# Patient Record
Sex: Female | Born: 1940 | Race: White | Hispanic: No | Marital: Married | Smoking: Former smoker
Health system: Southern US, Community
[De-identification: ages and names within clinical notes are randomized; demographics above are authoritative.]

## PROBLEM LIST (undated history)

## (undated) DIAGNOSIS — D649 Anemia, unspecified: Secondary | ICD-10-CM

## (undated) DIAGNOSIS — E049 Nontoxic goiter, unspecified: Secondary | ICD-10-CM

## (undated) DIAGNOSIS — K219 Gastro-esophageal reflux disease without esophagitis: Secondary | ICD-10-CM

## (undated) DIAGNOSIS — E785 Hyperlipidemia, unspecified: Secondary | ICD-10-CM

## (undated) DIAGNOSIS — M8580 Other specified disorders of bone density and structure, unspecified site: Secondary | ICD-10-CM

## (undated) DIAGNOSIS — C801 Malignant (primary) neoplasm, unspecified: Secondary | ICD-10-CM

## (undated) DIAGNOSIS — Z9889 Other specified postprocedural states: Secondary | ICD-10-CM

## (undated) DIAGNOSIS — R112 Nausea with vomiting, unspecified: Secondary | ICD-10-CM

## (undated) DIAGNOSIS — D469 Myelodysplastic syndrome, unspecified: Secondary | ICD-10-CM

## (undated) DIAGNOSIS — M1990 Unspecified osteoarthritis, unspecified site: Secondary | ICD-10-CM

## (undated) HISTORY — DX: Gastro-esophageal reflux disease without esophagitis: K21.9

## (undated) HISTORY — DX: Hyperlipidemia, unspecified: E78.5

## (undated) HISTORY — PX: TUBAL LIGATION: SHX77

## (undated) HISTORY — DX: Other specified disorders of bone density and structure, unspecified site: M85.80

## (undated) HISTORY — DX: Nontoxic goiter, unspecified: E04.9

---

## 1966-08-04 HISTORY — PX: LAPAROSCOPIC SALPINGOOPHERECTOMY: SUR795

## 1966-08-04 HISTORY — PX: ABDOMINAL SURGERY: SHX537

## 1997-08-04 HISTORY — PX: COLONOSCOPY: SHX174

## 2004-08-16 ENCOUNTER — Ambulatory Visit: Payer: Self-pay | Admitting: Otolaryngology

## 2004-11-19 ENCOUNTER — Ambulatory Visit: Payer: Self-pay | Admitting: Otolaryngology

## 2005-03-21 ENCOUNTER — Ambulatory Visit: Payer: Self-pay | Admitting: Otolaryngology

## 2005-04-23 ENCOUNTER — Ambulatory Visit: Payer: Self-pay | Admitting: Internal Medicine

## 2005-08-04 HISTORY — PX: FUNCTIONAL ENDOSCOPIC SINUS SURGERY: SUR616

## 2006-02-17 ENCOUNTER — Ambulatory Visit: Payer: Self-pay | Admitting: Gastroenterology

## 2006-04-28 ENCOUNTER — Ambulatory Visit: Payer: Self-pay | Admitting: Internal Medicine

## 2007-05-03 ENCOUNTER — Ambulatory Visit: Payer: Self-pay | Admitting: Internal Medicine

## 2007-12-28 ENCOUNTER — Ambulatory Visit: Payer: Self-pay | Admitting: Internal Medicine

## 2008-01-07 ENCOUNTER — Ambulatory Visit: Payer: Self-pay | Admitting: Internal Medicine

## 2008-05-03 ENCOUNTER — Ambulatory Visit: Payer: Self-pay | Admitting: Internal Medicine

## 2009-05-04 ENCOUNTER — Ambulatory Visit: Payer: Self-pay | Admitting: Internal Medicine

## 2009-06-16 ENCOUNTER — Ambulatory Visit: Payer: Self-pay | Admitting: Family Medicine

## 2010-05-06 ENCOUNTER — Ambulatory Visit: Payer: Self-pay | Admitting: Internal Medicine

## 2011-05-08 ENCOUNTER — Ambulatory Visit: Payer: Self-pay | Admitting: Internal Medicine

## 2011-05-28 ENCOUNTER — Ambulatory Visit: Payer: Self-pay | Admitting: Gastroenterology

## 2012-03-04 ENCOUNTER — Observation Stay: Payer: Self-pay | Admitting: Internal Medicine

## 2012-03-04 LAB — CBC
HGB: 14.2 g/dL (ref 12.0–16.0)
MCHC: 34.6 g/dL (ref 32.0–36.0)
RBC: 4.63 10*6/uL (ref 3.80–5.20)

## 2012-03-04 LAB — CK TOTAL AND CKMB (NOT AT ARMC)
CK, Total: 54 U/L (ref 21–215)
CK-MB: 0.7 ng/mL (ref 0.5–3.6)
CK-MB: 0.6 ng/mL (ref 0.5–3.6)

## 2012-03-04 LAB — PROTIME-INR: Prothrombin Time: 13 secs (ref 11.5–14.7)

## 2012-03-04 LAB — COMPREHENSIVE METABOLIC PANEL
Albumin: 3.7 g/dL (ref 3.4–5.0)
Alkaline Phosphatase: 99 U/L (ref 50–136)
Anion Gap: 9 (ref 7–16)
BUN: 18 mg/dL (ref 7–18)
Bilirubin,Total: 0.3 mg/dL (ref 0.2–1.0)
Calcium, Total: 8.8 mg/dL (ref 8.5–10.1)
Co2: 24 mmol/L (ref 21–32)
Creatinine: 0.82 mg/dL (ref 0.60–1.30)
EGFR (Non-African Amer.): >60
Osmolality: 279 (ref 275–301)
Potassium: 3.9 mmol/L (ref 3.5–5.1)
Sodium: 139 mmol/L (ref 136–145)
Total Protein: 7.3 g/dL (ref 6.4–8.2)

## 2012-03-04 LAB — APTT: Activated PTT: 37.6 secs — ABNORMAL HIGH (ref 23.6–35.9)

## 2012-03-05 LAB — TROPONIN I: Troponin-I: <0.02 ng/mL

## 2012-03-05 LAB — TSH: Thyroid Stimulating Horm: 0.741 u[IU]/mL

## 2012-03-05 LAB — LIPID PANEL
Cholesterol: 242 mg/dL — ABNORMAL HIGH (ref 0–200)
HDL Cholesterol: 46 mg/dL (ref 40–60)
Ldl Cholesterol, Calc: 162 mg/dL — ABNORMAL HIGH (ref 0–100)
VLDL Cholesterol, Calc: 34 mg/dL (ref 5–40)

## 2012-03-05 LAB — CK TOTAL AND CKMB (NOT AT ARMC): CK, Total: 49 U/L (ref 21–215)

## 2012-04-26 ENCOUNTER — Ambulatory Visit: Payer: Self-pay | Admitting: Gastroenterology

## 2012-04-28 LAB — PATHOLOGY REPORT

## 2012-06-01 ENCOUNTER — Ambulatory Visit: Payer: Self-pay | Admitting: Internal Medicine

## 2013-06-02 ENCOUNTER — Ambulatory Visit: Payer: Self-pay | Admitting: Internal Medicine

## 2014-06-11 DIAGNOSIS — E049 Nontoxic goiter, unspecified: Secondary | ICD-10-CM

## 2014-06-11 DIAGNOSIS — E040 Nontoxic diffuse goiter: Secondary | ICD-10-CM

## 2014-06-11 HISTORY — DX: Nontoxic diffuse goiter: E04.0

## 2014-06-11 HISTORY — DX: Nontoxic goiter, unspecified: E04.9

## 2014-06-21 ENCOUNTER — Ambulatory Visit: Payer: Self-pay | Admitting: Internal Medicine

## 2014-11-21 NOTE — Discharge Summary (Signed)
PATIENT NAME:  Kathryn Boyd, BACH MR#:  [id number] DATE OF BIRTH:  May 11, 1941  DATE OF ADMISSION:  03/04/2012 DATE OF DISCHARGE:  03/05/2012  ADMITTING DIAGNOSIS: Chest pain.   DISCHARGE DIAGNOSES:  1. Chest pain of unclear etiology, substernal, resolved.  2. Painful respirations. Negative cardiac enzymes for injury. Negative D-dimer. 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 Also questionable apical defect. However Dr. Clayborn Bigness reports poor test quality and patient needs outpatient cariology followup.  3. History of gastroesophageal reflux disease. 4. Hyperlipidemia with LDL 162.  5. Allergies.   DISCHARGE CONDITION: Stable.   DISCHARGE MEDICATIONS: The patient is to resume her outpatient medications which are:  1. Zyrtec 10 mg p.o. daily.  2. Vitamin B 12, unknown dose once daily.  3. Multivitamins once daily. 4. Vitamin E, unknown dose daily. 5. Vitamin C, unknown dose daily.  6. Flaxseed oil oral capsule once daily.  7. Caltrate 600 mg p.o. twice daily.  8. Fish oil 1 capsule twice daily.  9. Aspirin 81 mg p.o. daily.  10. Docusate 300 mg p.o. daily.  11. Flonase two sprays once daily in the evening.  12. Omeprazole 10 mg p.o. daily.   ADDITIONAL MEDICATIONS:  Lisinopril 2.5 mg p.o. daily.   DIET: 2 grams salt, low fat, low cholesterol.   PHYSICAL ACTIVITY LIMITATIONS: As tolerated.  FOLLOWUP: Follow-up appointment with Dr. Arline Asp two days after discharge as well as Dr. Saralyn Pilar two days after discharge.   CONSULTS: None.  RADIOLOGIC STUDIES:  1. Chest x-ray, portable single view 03/04/2012 showed no acute abnormality.  2. Myoview stress test result is still pending.   HISTORY OF PRESENT ILLNESS: The patient is a 74 year old Caucasian female with past medical history significant for history of gastroesophageal reflux disease, history of hyperlipidemia intolerant of statins, who presented to the hospital with complaints  of chest pain. Please refer to Dr. Kelton Pillar admission note on 03/04/2012. On arrival to the hospital, the patient's temperature was 96, pulse was 88, respiration rate 20, blood pressure 136/70, saturation was 98% on room air.  Physical exam was unremarkable.  LABORATORY DATA 03/04/2012 showed normal BMP, normal liver enzymes. Normal  cardiac enzymes, first set as well as subsequent two more sets. TSH was normal at 0.741. CBC was within normal limits. Coagulation panel was unremarkable except activated PTT was slightly up to 37.6. However, D-dimer was normal at 0.22. EKG showed normal sinus rhythm with possible left atrial enlargement, borderline EKG with no significant change since January 2004. Chest x-ray was also normal.   The patient was admitted to the hospital. Her cardiac enzymes were cycled. She was started on metoprolol as well as nitroglycerin and ACE inhibitor lisinopril. In regard to chest pains, as mentioned above the patient's cardiac enzymes were cycled and the patient underwent Myoview stress test. Myoview stress test was performed on 03/05/2012 and revealed suboptimal study with no clear evidence suggesting stress-induced myocardial ischemia. Depressed left ventricular function at 32% with global hypokinesis noted. This may represent suboptimal volume flow curve. Would treat the patient medically for now. According to the cardiologist, would have the patient follow up with cardiology shortly unless symptoms persist or worsen and would then consider cardiac catheterization if they do, but the patient should be safe to be discharged home with close followup according to Dr. Clayborn Bigness. The patient was recommended to continue aspirin therapy. She was added lisinopril for afterload reduction and she is being discharged home in stable condition. We were not able to add  metoprolol because the patient's blood pressure intermittently was very low.   On the day of discharge, the patient's vital signs:  Temperature 97.4, pulse 81, respiration rate was 17, blood pressure 94 to 449 systolic and 67R to 91M to diastolic, oxygen saturation 93% to 98% on room air at rest. It is recommended to advance the patient's lisinopril dose if she in fact, shows signs on echocardiogram consistent with cardiomyopathy. She was scheduled to see Dr. Clayborn Bigness. However, she refused to be seen by Dr. Clayborn Bigness and decided to see Dr. Saralyn Pilar instead. We will be making an appointment with Dr. Saralyn Pilar. She is also to follow up with Dr. Arline Asp as outpatient.   The patient had some painful respirations. However, her D-dimer was unremarkable and her oxygenation remained stable. She will be ambulated here around the hospital nursing station and if she does not have recurrent symptoms with ambulation she will be discharged home.   In regard to gastroesophageal reflux disease the patient is to continue her PPI.   For hyperlipidemia the patient's lipid panel was performed and the patient's LDL was found to be markedly elevated at 162. The patient's total cholesterol was 242. Triglycerides were 171 and HDL was 46. The patient was advised to continue just a low fat, low cholesterol diet and she was agreeable to follow up with that. It is recommended to follow her hyperlipidemia and make decisions about and initiation of any medications as needed. The patient, however, intolerant of statins.  For allergies the patient is to continue Zithromax and Flonase. No concerns were made here.   The patient is being discharged in stable condition with the above-mentioned medications and followup.   TIME SPENT: 40 minutes.  ____________________________ Theodoro Grist, MD rv:bjt D: 03/05/2012 16:53:47 ET T: 03/06/2012 14:19:11 ET JOB#: [id number]  cc: Theodoro Grist, MD, <Dictator> Vianne Bulls. Arline Asp, MD Isaias Cowman, MD [place] MD ELECTRONICALLY SIGNED 03/08/2012 1:15

## 2014-11-21 NOTE — H&P (Signed)
PATIENT NAME:  Kathryn Boyd, Kathryn Boyd MR#:  [id number] DATE OF BIRTH:  1940-09-17  DATE OF ADMISSION:  03/04/2012  REFERRING PHYSICIAN: ER physician, Dr. Renard Hamper    PRIMARY CARE PHYSICIAN: Apolonio Schneiders, MD   CHIEF COMPLAINT: Chest pain.   HISTORY OF PRESENT ILLNESS: The patient is a 74 year old female with past medical history of gastroesophageal reflux disease, chronic sinus problems, and hyperlipidemia not on any statin medications due to myalgia, who was in her usual state of health until today while shopping for school supplies at [place] she developed sudden onset of retrosternal chest pain described as sharp, possible indigestion, radiated to the back and jaw. The patient did not get any relief from her chest pain until she went to her car and sat down to rest. The patient reports she has had a stress test but it has been a very long time ago. She normally does not eat any fried food but did eat some fried okra last night but feels that that did not cause her GERD symptoms to flare up. About two years ago the patient was admitted to [facility] overnight for high heart rate. She had extensive work-up there which was all found to be negative. She followed up with cardiologist at [facility] after that admission and had tachycardia during the office visit and EKG at that time was normal. Subsequently she has had no problems with her heart. Denies any history of coronary artery disease. Denies any family history of coronary artery disease or premature coronary artery disease.   ALLERGIES: Codeine.   PAST MEDICAL HISTORY:  1. Internal hemorrhoids. 2. Gastroesophageal reflux disease. 3. Hyperlipidemia, off statins for the past one year due to myalgia.   PAST SURGICAL HISTORY:  1. Sinus surgery. 2. Endoscopy and colonoscopy. The colonoscopy showed internal hemorrhoids. Endoscopy was normal.  3. Tubal pregnancy surgery.   HOME MEDICATIONS: 1. Aspirin 81 mg daily.  2. Calcium 600 b.i.d.  3. Colace 100 mg 3 capsules  once a day. 4. Fish Oil 1 capsule b.i.d.  5. Flax Seed Oil 1 capsule once a day.  6. Flonase two sprays each nostril daily.  7. Multivitamin 1 tablet daily.  8. Omeprazole 20 mg daily.  9. Vitamin B12 daily.  10. Vitamin C daily.  11. Vitamin E daily.  12. Zyrtec 10 mg daily.   SOCIAL HISTORY: Denies any history of smoking, alcohol, or drug abuse. Married. Lives with her husband.   FAMILY HISTORY: Mother died of cancer. Father died of lung cancer. Mother and sister had mitral valve prolapse. Grandmother had cerebral hemorrhage.  REVIEW OF SYSTEMS: CONSTITUTIONAL: Denies any fever, fatigue, weakness. EYES: Denies any blurred or double vision. ENT: Denies any tinnitus or ear pain. RESPIRATORY: Denies any cough or wheezing. CARDIOVASCULAR: Reports chest pain. History of palpitations. GI: Denies any nausea, vomiting, diarrhea, abdominal pain. GU: Denies any dysuria or hematuria. ENDOCRINE: Denies any polyuria or nocturia. HEME/LYMPH: Denies any anemia or easy bruisability. INTEGUMENTARY: Denies any acne or rash. MUSCULOSKELETAL: Denies any swelling or gout. NEUROLOGICAL: Denies any numbness or weakness. PSYCH: Denies any anxiety or depression.   PHYSICAL EXAMINATION:   VITAL SIGNS: Temperature 96, heart rate 88, respiratory rate 20, blood pressure 136/70, pulse oximetry 98% on room air.   GENERAL: The patient is a 74 year old Caucasian female sitting comfortably in bed not in acute distress.   HEAD: Atraumatic, normocephalic.   EYES: There is no pallor, icterus, or cyanosis. Pupils equal, round, and reactive to light and accommodation. Extraocular movements intact.  ENT: Wet mucous membranes. No oropharyngeal erythema or thrush.   NECK: Supple. No masses. No JVD. No thyromegaly or lymphadenopathy.   CHEST WALL: No tenderness to palpation. Not using accessory muscles of respiration. No intercostal muscle retractions.   LUNGS: Bilaterally clear to auscultation. No wheezing, rales, or  rhonchi.  CARDIOVASCULAR: S1, S2 regular. No murmur, rubs, or gallops.   ABDOMEN: Soft, nontender, nondistended. No guarding or rigidity. No organomegaly.   SKIN: No rashes or lesions.   PERIPHERIES: No pedal edema. 2+ pedal pulses.   MUSCULOSKELETAL: No cyanosis or clubbing.   NEUROLOGICAL: Awake, alert, oriented x3. Nonfocal neurological exam. Cranial nerves grossly intact.   PSYCH: Normal mood and affect.   LABORATORY, DIAGNOSTIC, AND RADIOLOGICAL DATA: Chest x-ray showed no acute abnormalities. Cardiac enzymes are negative. CBC is normal. CMP is normal.   ASSESSMENT AND PLAN: This is a 73 year old female with past medical history of gastroesophageal reflux disease and hyperlipidemia not on any statin therapy who presents with chest pain.  1. Chest pain. The patient describes chest pain retrosternal radiating to the back and jaw relieved by rest. Will admit the patient to a telemetry bed. Check serial cardiac enzymes. Start her on aspirin, beta-blocker, ACE, nitro paste, and p.r.n. nitroglycerin. Will observe on telemetry. Check serial cardiac enzymes. Obtain an inpatient stress test. Further management will depend upon the stress test results. 2. History of gastroesophageal reflux disease. The patient feels that her current symptoms are not related to her gastroesophageal reflux disease although she did eat some fried okra last night. Will continue omeprazole.   3. History of hyperlipidemia, not on any statin therapy due to myalgia. Will check a fasting lipid profile. Will continue her Fish Oil. 4. History of palpitations for which she had work-up at [facility] about two years ago. Will check a TSH and monitor on telemetry.  Reviewed all medical records, discussed with the ED physician, discussed with the patient the plan of care and management.   TIME SPENT: 75 minutes.   ____________________________ Cherre Huger, MD sp:drc D: 03/04/2012 15:00:18 ET T: 03/04/2012 15:21:31  ET JOB#: [id number]  cc: Cherre Huger, MD, <Dictator> Vianne Bulls. Arline Asp, MD Cherre Huger MD ELECTRONICALLY SIGNED 03/04/2012 16:19

## 2015-06-14 ENCOUNTER — Other Ambulatory Visit: Payer: Self-pay | Admitting: Internal Medicine

## 2015-06-14 DIAGNOSIS — Z1231 Encounter for screening mammogram for malignant neoplasm of breast: Secondary | ICD-10-CM

## 2015-07-03 ENCOUNTER — Ambulatory Visit
Admission: RE | Admit: 2015-07-03 | Discharge: 2015-07-03 | Disposition: A | Discharge status: Home / Self Care | Payer: Medicare Other | Source: Ambulatory Visit | Attending: Internal Medicine | Admitting: Internal Medicine

## 2015-07-03 DIAGNOSIS — Z1231 Encounter for screening mammogram for malignant neoplasm of breast: Secondary | ICD-10-CM | POA: Diagnosis present

## 2015-07-03 HISTORY — DX: Malignant (primary) neoplasm, unspecified: C80.1

## 2015-11-12 ENCOUNTER — Inpatient Hospital Stay: Payer: Medicare Other

## 2015-11-12 ENCOUNTER — Encounter: Payer: Self-pay | Admitting: Oncology

## 2015-11-12 ENCOUNTER — Inpatient Hospital Stay: Payer: Medicare Other | Attending: Oncology | Admitting: Oncology

## 2015-11-12 VITALS — BP 134/82 | HR 86 | Temp 97.3°F | Resp 16 | Wt 168.2 lb

## 2015-11-12 DIAGNOSIS — K219 Gastro-esophageal reflux disease without esophagitis: Secondary | ICD-10-CM

## 2015-11-12 DIAGNOSIS — D4622 Refractory anemia with excess of blasts 2: Secondary | ICD-10-CM | POA: Diagnosis present

## 2015-11-12 DIAGNOSIS — D709 Neutropenia, unspecified: Secondary | ICD-10-CM | POA: Insufficient documentation

## 2015-11-12 DIAGNOSIS — D72819 Decreased white blood cell count, unspecified: Secondary | ICD-10-CM | POA: Diagnosis not present

## 2015-11-12 DIAGNOSIS — R7989 Other specified abnormal findings of blood chemistry: Secondary | ICD-10-CM | POA: Insufficient documentation

## 2015-11-12 DIAGNOSIS — Z85828 Personal history of other malignant neoplasm of skin: Secondary | ICD-10-CM | POA: Insufficient documentation

## 2015-11-12 DIAGNOSIS — D649 Anemia, unspecified: Secondary | ICD-10-CM | POA: Diagnosis not present

## 2015-11-12 DIAGNOSIS — M8580 Other specified disorders of bone density and structure, unspecified site: Secondary | ICD-10-CM | POA: Insufficient documentation

## 2015-11-12 DIAGNOSIS — Z7982 Long term (current) use of aspirin: Secondary | ICD-10-CM

## 2015-11-12 DIAGNOSIS — Z803 Family history of malignant neoplasm of breast: Secondary | ICD-10-CM | POA: Diagnosis not present

## 2015-11-12 DIAGNOSIS — E049 Nontoxic goiter, unspecified: Secondary | ICD-10-CM

## 2015-11-12 DIAGNOSIS — Z801 Family history of malignant neoplasm of trachea, bronchus and lung: Secondary | ICD-10-CM | POA: Diagnosis not present

## 2015-11-12 DIAGNOSIS — E785 Hyperlipidemia, unspecified: Secondary | ICD-10-CM | POA: Insufficient documentation

## 2015-11-12 LAB — CBC WITH DIFFERENTIAL/PLATELET
Basophils Absolute: 0.1 10*3/uL (ref 0–0.1)
Basophils Relative: 12 %
EOS ABS: 0 10*3/uL (ref 0–0.7)
HCT: 29.3 % — ABNORMAL LOW (ref 35.0–47.0)
HEMOGLOBIN: 9.9 g/dL — AB (ref 12.0–16.0)
LYMPHS ABS: 0.6 10*3/uL — AB (ref 1.0–3.6)
MCH: 33.7 pg (ref 26.0–34.0)
MCHC: 33.8 g/dL (ref 32.0–36.0)
MCV: 99.8 fL (ref 80.0–100.0)
Monocytes Absolute: 0 10*3/uL — ABNORMAL LOW (ref 0.2–0.9)
Neutro Abs: 0.3 10*3/uL — ABNORMAL LOW (ref 1.4–6.5)
Neutrophils Relative %: 24 %
PLATELETS: 308 10*3/uL (ref 150–440)
RBC: 2.93 MIL/uL — ABNORMAL LOW (ref 3.80–5.20)
RDW: 26 % — ABNORMAL HIGH (ref 11.5–14.5)
WBC: 1.1 10*3/uL — CL (ref 3.6–11.0)

## 2015-11-12 NOTE — Progress Notes (Signed)
Patient had abnormal labs on last check and has no history of anemia.

## 2015-11-13 NOTE — Progress Notes (Signed)
[facility]  [phone] [phone]  ID: Kathryn Boyd OB: 1941-02-02  MR#: [id number]  YCX#:[id number]  Patient Care Team: Ezequiel Kayser, MD as PCP - General (Internal Medicine)  CHIEF COMPLAINT:  Chief Complaint  Patient presents with  . New Evaluation    anemia/luekopenia    INTERVAL HISTORY: Patient is a 75 year old female with a history of anemia was also found to have a declining white blood cell count and neutropenia on laboratory work. She currently feels well. She denies any recent fevers or illnesses. She denies any night sweats or weight loss. She denies any repeated infections. She has no sick contacts. She denies any new medications. She has no neurologic complaints. She denies any chest pain, shortness of breath, cough, or hemoptysis. She denies any abdominal pain. She has no nausea, vomiting, constipation, or diarrhea. She has no urinary complaints. Patient feels at her baseline and offers no specific complaints today.  REVIEW OF SYSTEMS:   Review of Systems  Constitutional: Negative.  Negative for fever, chills, weight loss, malaise/fatigue and diaphoresis.  Respiratory: Negative.  Negative for cough and shortness of breath.   Cardiovascular: Negative.  Negative for chest pain.  Gastrointestinal: Negative.  Negative for blood in stool and melena.  Genitourinary: Negative.  Negative for dysuria.  Neurological: Negative.  Negative for weakness.  Psychiatric/Behavioral: Negative.     As per HPI. Otherwise, a complete review of systems is negatve.  PAST MEDICAL HISTORY: Past Medical History  Diagnosis Date  . Cancer ([place])     skin ca  . GERD (gastroesophageal reflux disease)   . Hyperlipidemia   . Osteopenia   . 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 Colonoscopy  1999  . Laparoscopic salpingoopherectomy   1968  . Functional endoscopic sinus surgery  2007  . Abdominal surgery  1968    Exploratory. Turned out tubal pregnancy  . Tubal ligation      FAMILY HISTORY Family History  Problem Relation Age of Onset  . Breast cancer Maternal Aunt 65  . Breast cancer Paternal Aunt 16  . Lung cancer Father   . Breast cancer Maternal Aunt   . Heart disease Mother   . Stroke Mother   . Breast cancer Paternal Aunt   . Breast cancer Paternal Aunt   . Colon polyps Sister   . Heart disease Sister   . Rheum arthritis Sister        ADVANCED DIRECTIVES:    HEALTH MAINTENANCE: Social History  Substance Use Topics  . Smoking status: Not on file  . Smokeless tobacco: Not on file  . Alcohol Use: Not on file     Colonoscopy:  PAP:  Bone density:  Lipid panel:  Allergies  Allergen Reactions  . Codeine Nausea And Vomiting  . Lubiprostone Other (See Comments)    Abdominal cramps  . Statins Other (See Comments)    Current Outpatient Prescriptions  Medication Sig Dispense Refill  . aspirin EC 81 MG tablet Take by mouth.    . Calcium Carbonate-Vitamin D 600-400 MG-UNIT tablet Take by mouth.    . cyanocobalamin (V-R VITAMIN B-12) 500 MCG tablet Take by mouth.    . Flaxseed, Linseed, (FLAX SEED OIL PO) Take by mouth.    . fluticasone (FLONASE) 50 MCG/ACT nasal spray Place into the nose.    . Multiple Vitamin (MULTI-VITAMINS) TABS Take  by mouth.    . niacin 50 MG tablet Take 50 mg by mouth at bedtime.    . Omega-3 Fatty Acids (FISH OIL) 1000 MG CAPS Take by mouth.    Marland Kitchen omeprazole (PRILOSEC) 20 MG capsule     . polyethylene glycol powder (GLYCOLAX/MIRALAX) powder Take by mouth.    Marland Kitchen PREMARIN vaginal cream     . vitamin C (ASCORBIC ACID) 500 MG tablet Take by mouth.     No current facility-administered medications for this visit.    OBJECTIVE: Filed Vitals:   11/12/15 1057  BP: 134/82  Pulse: 86  Temp: 97.3 F (36.3 C)  Resp: 16     There is no height on file to calculate BMI.     ECOG FS:0 - Asymptomatic  General: Well-developed, well-nourished, no acute distress. Eyes: Pink conjunctiva, anicteric sclera. HEENT: Normocephalic, moist mucous membranes, clear oropharnyx. Lungs: Clear to auscultation bilaterally. Heart: Regular rate and rhythm. No rubs, murmurs, or gallops. Abdomen: Soft, nontender, nondistended. No organomegaly noted, normoactive bowel sounds. Musculoskeletal: No edema, cyanosis, or clubbing. Neuro: Alert, answering all questions appropriately. Cranial nerves grossly intact. Skin: No rashes or petechiae noted. Psych: Normal affect. Lymphatics: No cervical, calvicular, axillary or inguinal LAD.   LAB RESULTS:  Lab Results  Component Value Date   NA 139 03/04/2012   K 3.9 03/04/2012   CL 106 03/04/2012   CO2 24 03/04/2012   GLUCOSE 94 03/04/2012   BUN 18 03/04/2012   CREATININE 0.82 03/04/2012   CALCIUM 8.8 03/04/2012   PROT 7.3 03/04/2012   ALBUMIN 3.7 03/04/2012   AST 32 03/04/2012   ALT 35 03/04/2012   ALKPHOS 99 03/04/2012   BILITOT 0.3 03/04/2012   GFRNONAA >60 03/04/2012   GFRAA >60 03/04/2012    Lab Results  Component Value Date   WBC 1.1* 11/12/2015   NEUTROABS 0.3* 11/12/2015   HGB 9.9* 11/12/2015   HCT 29.3* 11/12/2015   MCV 99.8 11/12/2015   PLT 308 11/12/2015     STUDIES: No results found.  ASSESSMENT: Anemia, significant neutropenia.  PLAN:    1. Neutropenia: Unclear etiology. Patient denies any recent viral illnesses or new medications. She is currently asymptomatic. Neutrophil antibodies are pending.  Given the degree of her neutropenia, have recommended a bone marrow biopsy for further evaluation. Patient will return to clinic in approximately 1 to 2 weeks for further evaluation and discussion of her biopsy results. 2. Anemia: Iron stores reported as within normal limits. 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 Bone marrow biopsy as above.  Approximately 45 minutes was spent in discussion of which greater than 50% was  consultation.  Patient expressed understanding and was in agreement with this plan. She also understands that She can call clinic at any time with any questions, concerns, or complaints.    Lloyd Huger, MD   11/13/2015 12:04 AM

## 2015-11-19 ENCOUNTER — Other Ambulatory Visit: Payer: Self-pay | Admitting: General Surgery

## 2015-11-20 ENCOUNTER — Ambulatory Visit
Admission: RE | Admit: 2015-11-20 | Discharge: 2015-11-20 | Disposition: A | Discharge status: Home / Self Care | Payer: Medicare Other | Source: Ambulatory Visit | Attending: Oncology | Admitting: Oncology

## 2015-11-20 DIAGNOSIS — D709 Neutropenia, unspecified: Secondary | ICD-10-CM | POA: Diagnosis not present

## 2015-11-20 DIAGNOSIS — D72819 Decreased white blood cell count, unspecified: Secondary | ICD-10-CM

## 2015-11-20 HISTORY — DX: Anemia, unspecified: D64.9

## 2015-11-20 HISTORY — DX: Nausea with vomiting, unspecified: R11.2

## 2015-11-20 HISTORY — DX: Unspecified osteoarthritis, unspecified site: M19.90

## 2015-11-20 HISTORY — DX: Other specified postprocedural states: Z98.890

## 2015-11-20 LAB — DIFFERENTIAL
Basophils Absolute: 0 10*3/uL (ref 0–0.1)
Basophils Relative: 2 %
EOS ABS: 0 10*3/uL (ref 0–0.7)
EOS PCT: 2 %
LYMPHS PCT: 57 %
Lymphs Abs: 0.5 10*3/uL — ABNORMAL LOW (ref 1.0–3.6)
MONO ABS: 0 10*3/uL — AB (ref 0.2–0.9)
Monocytes Relative: 6 %
NEUTROS PCT: 33 %
Neutro Abs: 0.3 10*3/uL — ABNORMAL LOW (ref 1.4–6.5)

## 2015-11-20 LAB — COMP PANEL: LEUKEMIA/LYMPHOMA

## 2015-11-20 LAB — CBC
HCT: 27.1 % — ABNORMAL LOW (ref 35.0–47.0)
Hemoglobin: 9 g/dL — ABNORMAL LOW (ref 12.0–16.0)
MCH: 33.9 pg (ref 26.0–34.0)
MCHC: 33.3 g/dL (ref 32.0–36.0)
MCV: 101.9 fL — ABNORMAL HIGH (ref 80.0–100.0)
PLATELETS: 263 10*3/uL (ref 150–440)
RBC: 2.66 MIL/uL — ABNORMAL LOW (ref 3.80–5.20)
RDW: 26.7 % — AB (ref 11.5–14.5)
WBC: 0.8 10*3/uL — AB (ref 3.6–11.0)

## 2015-11-20 LAB — PROTIME-INR
INR: 1.08
Prothrombin Time: 14.2 seconds (ref 11.4–15.0)

## 2015-11-20 LAB — NEUTROPHIL AB TEST LEVEL 1: NEUTROPHIL SCR/PANEL INTERP.: NEGATIVE

## 2015-11-20 LAB — APTT: APTT: 36 s (ref 24–36)

## 2015-11-20 MED ORDER — HEPARIN SOD (PORK) LOCK FLUSH 100 UNIT/ML IV SOLN
INTRAVENOUS | Status: AC
  Filled 2015-11-20: qty 5

## 2015-11-20 MED ORDER — MIDAZOLAM HCL 5 MG/5ML IJ SOLN
INTRAMUSCULAR | Status: AC
  Filled 2015-11-20: qty 5

## 2015-11-20 MED ORDER — FENTANYL CITRATE (PF) 100 MCG/2ML IJ SOLN
INTRAMUSCULAR | Status: AC | PRN
  Administered 2015-11-20: 25 ug via INTRAVENOUS

## 2015-11-20 MED ORDER — BUPIVACAINE HCL (PF) 0.25 % IJ SOLN
INTRAMUSCULAR | Status: AC | PRN
  Administered 2015-11-20: 10 mL

## 2015-11-20 MED ORDER — MIDAZOLAM HCL 5 MG/5ML IJ SOLN
INTRAMUSCULAR | Status: AC | PRN
Start: 2015-11-20 — End: 2015-11-20
  Administered 2015-11-20: 1 mg via INTRAVENOUS

## 2015-11-20 MED ORDER — SODIUM CHLORIDE 0.9 % IV SOLN
INTRAVENOUS | Status: DC
  Administered 2015-11-20: 08:00:00 via INTRAVENOUS

## 2015-11-20 MED ORDER — BUPIVACAINE HCL (PF) 0.25 % IJ SOLN
INTRAMUSCULAR | Status: AC
  Filled 2015-11-20: qty 30

## 2015-11-20 MED ORDER — FENTANYL CITRATE (PF) 100 MCG/2ML IJ SOLN
INTRAMUSCULAR | Status: AC
  Filled 2015-11-20: qty 2

## 2015-11-20 NOTE — Procedures (Signed)
CT bone marrow biopsy  Complications:  None  Blood Loss: none  See dictation in canopy pacs

## 2015-11-25 NOTE — Progress Notes (Deleted)
This encounter was created in error - please disregard.

## 2015-11-27 ENCOUNTER — Inpatient Hospital Stay (HOSPITAL_BASED_OUTPATIENT_CLINIC_OR_DEPARTMENT_OTHER): Payer: Medicare Other | Admitting: Oncology

## 2015-11-27 VITALS — BP 114/77 | HR 90 | Temp 97.9°F | Resp 16 | Wt 167.5 lb

## 2015-11-27 DIAGNOSIS — D4622 Refractory anemia with excess of blasts 2: Secondary | ICD-10-CM

## 2015-11-27 DIAGNOSIS — K219 Gastro-esophageal reflux disease without esophagitis: Secondary | ICD-10-CM

## 2015-11-27 DIAGNOSIS — E785 Hyperlipidemia, unspecified: Secondary | ICD-10-CM

## 2015-11-27 DIAGNOSIS — R7989 Other specified abnormal findings of blood chemistry: Secondary | ICD-10-CM

## 2015-11-27 DIAGNOSIS — Z85828 Personal history of other malignant neoplasm of skin: Secondary | ICD-10-CM

## 2015-11-27 DIAGNOSIS — Z7982 Long term (current) use of aspirin: Secondary | ICD-10-CM

## 2015-11-27 DIAGNOSIS — E049 Nontoxic goiter, unspecified: Secondary | ICD-10-CM

## 2015-11-27 DIAGNOSIS — D72819 Decreased white blood cell count, unspecified: Secondary | ICD-10-CM

## 2015-11-27 DIAGNOSIS — D709 Neutropenia, unspecified: Secondary | ICD-10-CM | POA: Diagnosis not present

## 2015-11-27 DIAGNOSIS — Z803 Family history of malignant neoplasm of breast: Secondary | ICD-10-CM

## 2015-11-27 DIAGNOSIS — Z801 Family history of malignant neoplasm of trachea, bronchus and lung: Secondary | ICD-10-CM

## 2015-11-27 DIAGNOSIS — M8580 Other specified disorders of bone density and structure, unspecified site: Secondary | ICD-10-CM

## 2015-11-27 NOTE — Progress Notes (Signed)
Patient is feeling more fatigued. 

## 2015-12-02 DIAGNOSIS — D4622 Refractory anemia with excess of blasts 2: Secondary | ICD-10-CM | POA: Insufficient documentation

## 2015-12-02 MED ORDER — PROCHLORPERAZINE MALEATE 10 MG PO TABS: 10.0000 mg | ORAL_TABLET | Freq: Four times a day (QID) | ORAL | Status: DC | PRN

## 2015-12-02 MED ORDER — ONDANSETRON HCL 8 MG PO TABS: 8.0000 mg | ORAL_TABLET | Freq: Two times a day (BID) | ORAL | Status: DC | PRN

## 2015-12-02 NOTE — Progress Notes (Signed)
[name]  Telephone:([phone] Fax:(336) [phone]  ID: Kathryn Boyd OB: 03/08/1941  MR#: [id number]  YFV#:[id number]  Patient Care Team: Ezequiel Kayser, MD as PCP - General (Internal Medicine)  CHIEF COMPLAINT:  Chief Complaint  Patient presents with  . leukopenia  . Results    INTERVAL HISTORY: Patient returns to clinic today for further evaluation and discussion of her bone marrow biopsy results. She feels slightly more fatigued, but otherwise feels well. She denies any recent fevers or illnesses. She denies any night sweats or weight loss. She denies any repeated infections. She has no neurologic complaints. She denies any chest pain, shortness of breath, cough, or hemoptysis. She denies any abdominal pain. She has no nausea, vomiting, constipation, or diarrhea. She has no urinary complaints. Patient offers no further specific complaints today.  REVIEW OF SYSTEMS:   Review of Systems  Constitutional: Positive for malaise/fatigue. Negative for fever, chills, weight loss and diaphoresis.  Respiratory: Negative.  Negative for cough and shortness of breath.   Cardiovascular: Negative.  Negative for chest pain.  Gastrointestinal: Negative.  Negative for blood in stool and melena.  Genitourinary: Negative.  Negative for dysuria.  Neurological: Negative.  Negative for weakness.  Psychiatric/Behavioral: Negative.     As per HPI. Otherwise, a complete review of systems is negatve.  PAST MEDICAL HISTORY: Past Medical History  Diagnosis Date  . Cancer ([place])     skin ca  . GERD (gastroesophageal reflux disease)   . Hyperlipidemia   . Osteopenia   . 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 PONV (postoperative nausea and vomiting)   . Arthritis   . Anemia     PAST SURGICAL HISTORY: Past Surgical History  Procedure Laterality Date  . Colonoscopy  1999  . Laparoscopic salpingoopherectomy  1968  . Functional  endoscopic sinus surgery  2007  . Abdominal surgery  1968    Exploratory. Turned out tubal pregnancy  . Tubal ligation      FAMILY HISTORY Family History  Problem Relation Age of Onset  . Breast cancer Maternal Aunt 65  . Breast cancer Paternal Aunt 57  . Lung cancer Father   . Breast cancer Maternal Aunt   . Heart disease Mother   . Stroke Mother   . Breast cancer Paternal Aunt   . Breast cancer Paternal Aunt   . Colon polyps Sister   . Heart disease Sister   . Rheum arthritis Sister        ADVANCED DIRECTIVES:    HEALTH MAINTENANCE: Social History  Substance Use Topics  . Smoking status: Former Smoker    Quit date: 11/19/1976  . Smokeless tobacco: Never Used  . Alcohol Use: Yes     Comment: rarely     Colonoscopy:  PAP:  Bone density:  Lipid panel:  Allergies  Allergen Reactions  . Codeine Nausea And Vomiting  . Lubiprostone Other (See Comments)    Abdominal cramps  . Statins Other (See Comments)    Current Outpatient Prescriptions  Medication Sig Dispense Refill  . aspirin EC 81 MG tablet Take by mouth.    . Calcium Carbonate-Vitamin D 600-400 MG-UNIT tablet Take by mouth.    . fluticasone (FLONASE) 50 MCG/ACT nasal spray Place into the nose.    Marland Kitchen omeprazole (PRILOSEC) 20 MG capsule     . PREMARIN vaginal cream Reported on 11/20/2015    . cyanocobalamin (V-R VITAMIN B-12) 500 MCG tablet Take by  mouth. Reported on 11/27/2015    . docusate sodium (COLACE) 100 MG capsule Take 100 mg by mouth 2 (two) times daily. Reported on 11/27/2015    . Flaxseed, Linseed, (FLAX SEED OIL PO) Take by mouth. Reported on 11/27/2015    . Multiple Vitamin (MULTI-VITAMINS) TABS Take by mouth. Reported on 11/27/2015    . niacin 50 MG tablet Take 50 mg by mouth at bedtime. Reported on 11/27/2015    . Omega-3 Fatty Acids (FISH OIL) 1000 MG CAPS Take by mouth. Reported on 11/27/2015    . polyethylene glycol powder (GLYCOLAX/MIRALAX) powder Take by mouth. Reported on 11/27/2015    .  vitamin C (ASCORBIC ACID) 500 MG tablet Take by mouth. Reported on 11/27/2015     No current facility-administered medications for this visit.    OBJECTIVE: Filed Vitals:   11/27/15 1159  BP: 114/77  Pulse: 90  Temp: 97.9 F (36.6 C)  Resp: 16     Body mass index is 28.33 kg/(m^2).    ECOG FS:0 - Asymptomatic  General: Well-developed, well-nourished, no acute distress. Eyes: Pink conjunctiva, anicteric sclera. Lungs: Clear to auscultation bilaterally. Heart: Regular rate and rhythm. No rubs, murmurs, or gallops. Abdomen: Soft, nontender, nondistended. No organomegaly noted, normoactive bowel sounds. Musculoskeletal: No edema, cyanosis, or clubbing. Neuro: Alert, answering all questions appropriately. Cranial nerves grossly intact. Skin: No rashes or petechiae noted. Psych: Normal affect.   LAB RESULTS:  Lab Results  Component Value Date   NA 139 03/04/2012   K 3.9 03/04/2012   CL 106 03/04/2012   CO2 24 03/04/2012   GLUCOSE 94 03/04/2012   BUN 18 03/04/2012   CREATININE 0.82 03/04/2012   CALCIUM 8.8 03/04/2012   PROT 7.3 03/04/2012   ALBUMIN 3.7 03/04/2012   AST 32 03/04/2012   ALT 35 03/04/2012   ALKPHOS 99 03/04/2012   BILITOT 0.3 03/04/2012   GFRNONAA >60 03/04/2012   GFRAA >60 03/04/2012    Lab Results  Component Value Date   WBC 0.8* 11/20/2015   NEUTROABS 0.3* 11/20/2015   HGB 9.0* 11/20/2015   HCT 27.1* 11/20/2015   MCV 101.9* 11/20/2015   PLT 263 11/20/2015     STUDIES: Ct Biopsy  11/20/2015  INDICATION: Neutropenia EXAM: CT BIOPSY MEDICATIONS: None. ANESTHESIA/SEDATION: Moderate (conscious) sedation was employed during this procedure. A total of Versed 1 mg and Fentanyl 25 mcg was administered intravenously. Moderate Sedation Time: 16 minutes. The patient's level of consciousness and vital signs were monitored continuously by radiology nursing throughout the procedure under my direct supervision. FLUOROSCOPY TIME:  Not applicable COMPLICATIONS:  None immediate. PROCEDURE: Informed written consent was obtained from the patient after a thorough discussion of the procedural risks, benefits and alternatives. All questions were addressed. Maximal Sterile Barrier Technique was utilized including caps, mask, sterile gowns, sterile gloves, sterile drape, hand hygiene and skin antiseptic. A timeout was performed prior to the initiation of the procedure. Following this the patient was placed on the CT table in the prone position. Initial imaging was performed to localize the left iliac bone. An appropriate area was marked and the skin was prepped in the standard sterile manner utilizing chlorhexidine. Following this utilizing 0.25% Marcaine and CT fluoroscopic guidance, a bone marrow biopsy needle was placed percutaneously into the left iliac bone. Aspiration was then performed and deemed the less than adequate by the cytotechnologist. Additional samples were obtained and also not felt to be of an adequate nature. Subsequently, a bone core was obtained. A second core was obtained  due to the limited nature of the bone marrow aspiration. These were both felt to be satisfactory. Needles were then removed. Hemostasis was obtained at the puncture site. The patient tolerated the procedure well was returned to her room in satisfactory condition. IMPRESSION: Successful CT-guided bone marrow biopsy and aspiration. Electronically Signed   By: Inez Catalina M.D.   On: 11/20/2015 09:42    ASSESSMENT: MDS, specifically refractory anemia with excess blasts-2.   PLAN:    1. 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 Patient has approximately 15% blasts in her bone marrow. She was also noted to have multilineage dyspoiesis and atypical megakaryocytes. Cytogenetics is currently pending. Patient's IPSS score is 2.0 which is intermediate risk giving her a median survival of 1.2 years. She is also at increased risk of progressing to acute leukemia, therefore will proceed  with treatment using subcutaneous Vidaza 56m/m2/d on days 1 through 5 and days 8 and 9 on a 28 day cycle. Return to clinic on Dec 03, 2015 to initiate cycle 1, day 1.   Approximately 30 minutes was spent in discussion of which greater than 50% was consultation.  Patient expressed understanding and was in agreement with this plan. She also understands that She can call clinic at any time with any questions, concerns, or complaints.    TLloyd Huger MD   12/02/2015 8:21 AM

## 2015-12-03 NOTE — Patient Instructions (Signed)
Azacitidine suspension for injection (subcutaneous use) What is this medicine? AZACITIDINE (ay za SITE i deen) is a chemotherapy drug. This medicine reduces the growth of cancer cells and can suppress the immune system. It is used for treating myelodysplastic syndrome or some types of leukemia. This medicine may be used for other purposes; ask your health care provider or pharmacist if you have questions. What should I tell my health care provider before I take this medicine? They need to know if you have any of these conditions: -infection (especially a virus infection such as chickenpox, cold sores, or herpes) -kidney disease -liver disease -liver tumors -an unusual or allergic reaction to azacitidine, mannitol, other medicines, foods, dyes, or preservatives -pregnant or trying to get pregnant -breast-feeding How should I use this medicine? This medicine is for injection under the skin. It is administered in a hospital or clinic by a specially trained health care professional. Talk to your pediatrician regarding the use of this medicine in children. While this drug may be prescribed for selected conditions, precautions do apply. Overdosage: If you think you have taken too much of this medicine contact a poison control center or emergency room at once. NOTE: This medicine is only for you. Do not share this medicine with others. What if I miss a dose? It is important not to miss your dose. Call your doctor or health care professional if you are unable to keep an appointment. What may interact with this medicine? Interactions have not been studied. Give your health care provider a list of all the medicines, herbs, non-prescription drugs, or dietary supplements you use. Also tell them if you smoke, drink alcohol, or use illegal drugs. Some items may interact with your medicine. This list may not describe all possible interactions. Give your health care provider a list of all the medicines,  herbs, non-prescription drugs, or dietary supplements you use. Also tell them if you smoke, drink alcohol, or use illegal drugs. Some items may interact with your medicine. What should I watch for while using this medicine? Visit your doctor for checks on your progress. This drug may make you feel generally unwell. This is not uncommon, as chemotherapy can affect healthy cells as well as cancer cells. Report any side effects. Continue your course of treatment even though you feel ill unless your doctor tells you to stop. In some cases, you may be given additional medicines to help with side effects. Follow all directions for their use. Call your doctor or health care professional for advice if you get a fever, chills or sore throat, or other symptoms of a cold or flu. Do not treat yourself. This drug decreases your body's ability to fight infections. Try to avoid being around people who are sick. This medicine may increase your risk to bruise or bleed. Call your doctor or health care professional if you notice any unusual bleeding. Do not have any vaccinations without your doctor's approval and avoid anyone who has recently had oral polio vaccine. Do not become pregnant while taking this medicine. Women should inform their doctor if they wish to become pregnant or think they might be pregnant. There is a potential for serious side effects to an unborn child. Talk to your health care professional or pharmacist for more information. Do not breast-feed an infant while taking this medicine. If you are a man, you should not father a child while receiving treatment. What side effects may I notice from receiving this medicine? Side effects that you should report   to your doctor or health care professional as soon as possible: -allergic reactions like skin rash, itching or hives, swelling of the face, lips, or tongue -low blood counts - this medicine may decrease the number of white blood cells, red blood cells  and platelets. You may be at increased risk for infections and bleeding. -signs of infection - fever or chills, cough, sore throat, pain or difficulty passing urine -signs of decreased platelets or bleeding - bruising, pinpoint red spots on the skin, black, tarry stools, blood in the urine -signs of decreased red blood cells - unusually weak or tired, fainting spells, lightheadedness -reactions at the injection site including redness, pain, itching, or bruising -breathing problems -changes in vision -fever -mouth sores -stomach pain -vomiting Side effects that usually do not require medical attention (report to your doctor or health care professional if they continue or are bothersome): -constipation -diarrhea -loss of appetite -nausea -pain or redness at the injection site -weak or tired This list may not describe all possible side effects. Call your doctor for medical advice about side effects. You may report side effects to FDA at 1-800-FDA-1088. Where should I keep my medicine? This drug is given in a hospital or clinic and will not be stored at home. NOTE: This sheet is a summary. It may not cover all possible information. If you have questions about this medicine, talk to your doctor, pharmacist, or health care provider.    2016, Elsevier/Gold Standard. (2014-04-14 18:13:53)  

## 2015-12-04 ENCOUNTER — Inpatient Hospital Stay: Payer: Medicare Other | Attending: Oncology

## 2015-12-04 DIAGNOSIS — D471 Chronic myeloproliferative disease: Secondary | ICD-10-CM | POA: Insufficient documentation

## 2015-12-04 DIAGNOSIS — D4622 Refractory anemia with excess of blasts 2: Secondary | ICD-10-CM | POA: Insufficient documentation

## 2015-12-04 DIAGNOSIS — E785 Hyperlipidemia, unspecified: Secondary | ICD-10-CM | POA: Diagnosis not present

## 2015-12-04 DIAGNOSIS — R531 Weakness: Secondary | ICD-10-CM | POA: Diagnosis not present

## 2015-12-04 DIAGNOSIS — Z7982 Long term (current) use of aspirin: Secondary | ICD-10-CM | POA: Diagnosis not present

## 2015-12-04 DIAGNOSIS — Z87891 Personal history of nicotine dependence: Secondary | ICD-10-CM | POA: Insufficient documentation

## 2015-12-04 DIAGNOSIS — Z800 Family history of malignant neoplasm of digestive organs: Secondary | ICD-10-CM | POA: Diagnosis not present

## 2015-12-04 DIAGNOSIS — Z85828 Personal history of other malignant neoplasm of skin: Secondary | ICD-10-CM | POA: Insufficient documentation

## 2015-12-04 DIAGNOSIS — Z803 Family history of malignant neoplasm of breast: Secondary | ICD-10-CM | POA: Diagnosis not present

## 2015-12-04 DIAGNOSIS — K649 Unspecified hemorrhoids: Secondary | ICD-10-CM | POA: Diagnosis not present

## 2015-12-04 DIAGNOSIS — D61818 Other pancytopenia: Secondary | ICD-10-CM | POA: Diagnosis not present

## 2015-12-04 DIAGNOSIS — Z79899 Other long term (current) drug therapy: Secondary | ICD-10-CM | POA: Diagnosis not present

## 2015-12-04 DIAGNOSIS — R5383 Other fatigue: Secondary | ICD-10-CM | POA: Diagnosis not present

## 2015-12-04 DIAGNOSIS — G4700 Insomnia, unspecified: Secondary | ICD-10-CM | POA: Diagnosis not present

## 2015-12-04 DIAGNOSIS — K219 Gastro-esophageal reflux disease without esophagitis: Secondary | ICD-10-CM | POA: Insufficient documentation

## 2015-12-04 DIAGNOSIS — M818 Other osteoporosis without current pathological fracture: Secondary | ICD-10-CM | POA: Insufficient documentation

## 2015-12-04 DIAGNOSIS — M129 Arthropathy, unspecified: Secondary | ICD-10-CM | POA: Diagnosis not present

## 2015-12-04 DIAGNOSIS — E049 Nontoxic goiter, unspecified: Secondary | ICD-10-CM | POA: Diagnosis not present

## 2015-12-05 ENCOUNTER — Other Ambulatory Visit: Payer: Self-pay | Admitting: *Deleted

## 2015-12-06 ENCOUNTER — Telehealth: Payer: Self-pay | Admitting: *Deleted

## 2015-12-06 NOTE — Telephone Encounter (Signed)
Temp normally 97, but got up to 99.7 she called on call who told her to take Tylenol which she did and her temp is 97.2 this morning

## 2015-12-10 ENCOUNTER — Inpatient Hospital Stay (HOSPITAL_BASED_OUTPATIENT_CLINIC_OR_DEPARTMENT_OTHER): Payer: Medicare Other | Admitting: Oncology

## 2015-12-10 ENCOUNTER — Inpatient Hospital Stay (HOSPITAL_BASED_OUTPATIENT_CLINIC_OR_DEPARTMENT_OTHER): Payer: Medicare Other

## 2015-12-10 ENCOUNTER — Inpatient Hospital Stay: Payer: Medicare Other

## 2015-12-10 VITALS — BP 144/84 | HR 102 | Temp 99.1°F | Resp 16 | Wt 165.3 lb

## 2015-12-10 DIAGNOSIS — R5383 Other fatigue: Secondary | ICD-10-CM | POA: Diagnosis not present

## 2015-12-10 DIAGNOSIS — M818 Other osteoporosis without current pathological fracture: Secondary | ICD-10-CM

## 2015-12-10 DIAGNOSIS — K219 Gastro-esophageal reflux disease without esophagitis: Secondary | ICD-10-CM

## 2015-12-10 DIAGNOSIS — M129 Arthropathy, unspecified: Secondary | ICD-10-CM

## 2015-12-10 DIAGNOSIS — R531 Weakness: Secondary | ICD-10-CM

## 2015-12-10 DIAGNOSIS — Z800 Family history of malignant neoplasm of digestive organs: Secondary | ICD-10-CM

## 2015-12-10 DIAGNOSIS — Z7982 Long term (current) use of aspirin: Secondary | ICD-10-CM

## 2015-12-10 DIAGNOSIS — D4622 Refractory anemia with excess of blasts 2: Secondary | ICD-10-CM

## 2015-12-10 DIAGNOSIS — Z79899 Other long term (current) drug therapy: Secondary | ICD-10-CM

## 2015-12-10 DIAGNOSIS — Z803 Family history of malignant neoplasm of breast: Secondary | ICD-10-CM

## 2015-12-10 DIAGNOSIS — E785 Hyperlipidemia, unspecified: Secondary | ICD-10-CM

## 2015-12-10 DIAGNOSIS — Z87891 Personal history of nicotine dependence: Secondary | ICD-10-CM

## 2015-12-10 DIAGNOSIS — Z85828 Personal history of other malignant neoplasm of skin: Secondary | ICD-10-CM

## 2015-12-10 DIAGNOSIS — D471 Chronic myeloproliferative disease: Secondary | ICD-10-CM

## 2015-12-10 DIAGNOSIS — E049 Nontoxic goiter, unspecified: Secondary | ICD-10-CM

## 2015-12-10 LAB — CBC WITH DIFFERENTIAL/PLATELET
Basophils Absolute: 0 10*3/uL (ref 0–0.1)
Basophils Relative: 0 %
Eosinophils Absolute: 0 10*3/uL (ref 0–0.7)
Eosinophils Relative: 1 %
HEMATOCRIT: 23.3 % — AB (ref 35.0–47.0)
HEMOGLOBIN: 8 g/dL — AB (ref 12.0–16.0)
LYMPHS ABS: 0.5 10*3/uL — AB (ref 1.0–3.6)
MCH: 34.8 pg — AB (ref 26.0–34.0)
MCHC: 34.4 g/dL (ref 32.0–36.0)
MCV: 100.9 fL — ABNORMAL HIGH (ref 80.0–100.0)
Monocytes Absolute: 0.1 10*3/uL — ABNORMAL LOW (ref 0.2–0.9)
NEUTROS ABS: 0.2 10*3/uL — AB (ref 1.4–6.5)
Platelets: 215 10*3/uL (ref 150–440)
RBC: 2.3 MIL/uL — ABNORMAL LOW (ref 3.80–5.20)
RDW: 27.1 % — ABNORMAL HIGH (ref 11.5–14.5)
WBC: 0.8 10*3/uL — CL (ref 3.6–11.0)

## 2015-12-10 LAB — BASIC METABOLIC PANEL
Anion gap: 8 (ref 5–15)
BUN: 15 mg/dL (ref 6–20)
CHLORIDE: 103 mmol/L (ref 101–111)
CO2: 25 mmol/L (ref 22–32)
CREATININE: 0.83 mg/dL (ref 0.44–1.00)
Calcium: 8.8 mg/dL — ABNORMAL LOW (ref 8.9–10.3)
GFR calc Af Amer: >60 mL/min (ref >60)
GFR calc non Af Amer: >60 mL/min (ref >60)
Glucose, Bld: 152 mg/dL — ABNORMAL HIGH (ref 65–99)
POTASSIUM: 3.8 mmol/L (ref 3.5–5.1)
Sodium: 136 mmol/L (ref 135–145)

## 2015-12-10 MED ORDER — ONDANSETRON HCL 4 MG PO TABS
8.0000 mg | ORAL_TABLET | Freq: Once | ORAL | Status: AC
  Administered 2015-12-10: 8 mg via ORAL
  Filled 2015-12-10: qty 2

## 2015-12-10 MED ORDER — AZACITIDINE CHEMO SQ INJECTION
75.0000 mg/m2 | Freq: Once | INTRAMUSCULAR | Status: AC
  Administered 2015-12-10: 140 mg via SUBCUTANEOUS
  Filled 2015-12-10: qty 5.6

## 2015-12-10 NOTE — Progress Notes (Signed)
Patient reports having good and bad days when it comes to her energy with yesterday being a good day.  She has run a low grade fever for 2 nights with temp being 99.7 at home and her temp today is 99.1 in the office.

## 2015-12-11 ENCOUNTER — Telehealth: Payer: Self-pay | Admitting: *Deleted

## 2015-12-11 ENCOUNTER — Inpatient Hospital Stay: Payer: Medicare Other

## 2015-12-11 VITALS — BP 103/70 | HR 101 | Temp 99.1°F | Resp 18

## 2015-12-11 DIAGNOSIS — D4622 Refractory anemia with excess of blasts 2: Secondary | ICD-10-CM | POA: Diagnosis not present

## 2015-12-11 MED ORDER — AZACITIDINE CHEMO SQ INJECTION
75.0000 mg/m2 | Freq: Once | INTRAMUSCULAR | Status: AC
  Administered 2015-12-11: 140 mg via SUBCUTANEOUS
  Filled 2015-12-11: qty 5.6

## 2015-12-11 MED ORDER — ONDANSETRON HCL 4 MG PO TABS
8.0000 mg | ORAL_TABLET | Freq: Once | ORAL | Status: AC
  Administered 2015-12-11: 8 mg via ORAL
  Filled 2015-12-11: qty 2

## 2015-12-11 NOTE — Telephone Encounter (Signed)
Asking if Dr Grayland Ormond decided if she can take Cystex for her kidneys

## 2015-12-11 NOTE — Telephone Encounter (Signed)
Per Dr. Grayland Ormond it is fine for patient to take Cystex.

## 2015-12-11 NOTE — Telephone Encounter (Signed)
Patient informed ok to use Cystex

## 2015-12-12 ENCOUNTER — Inpatient Hospital Stay: Payer: Medicare Other

## 2015-12-12 DIAGNOSIS — D4622 Refractory anemia with excess of blasts 2: Secondary | ICD-10-CM

## 2015-12-12 MED ORDER — AZACITIDINE CHEMO SQ INJECTION
75.0000 mg/m2 | Freq: Once | INTRAMUSCULAR | Status: AC
  Administered 2015-12-12: 140 mg via SUBCUTANEOUS
  Filled 2015-12-12: qty 5.6

## 2015-12-12 MED ORDER — ONDANSETRON HCL 4 MG PO TABS
8.0000 mg | ORAL_TABLET | Freq: Once | ORAL | Status: AC
  Administered 2015-12-12: 8 mg via ORAL
  Filled 2015-12-12: qty 2

## 2015-12-13 ENCOUNTER — Inpatient Hospital Stay: Payer: Medicare Other

## 2015-12-13 VITALS — BP 121/75 | HR 106 | Temp 97.2°F | Resp 18

## 2015-12-13 DIAGNOSIS — D4622 Refractory anemia with excess of blasts 2: Secondary | ICD-10-CM | POA: Diagnosis not present

## 2015-12-13 MED ORDER — ONDANSETRON HCL 4 MG PO TABS
8.0000 mg | ORAL_TABLET | Freq: Once | ORAL | Status: AC
  Administered 2015-12-13: 8 mg via ORAL
  Filled 2015-12-13: qty 2

## 2015-12-13 MED ORDER — AZACITIDINE CHEMO SQ INJECTION
75.0000 mg/m2 | Freq: Once | INTRAMUSCULAR | Status: AC
  Administered 2015-12-13: 140 mg via SUBCUTANEOUS
  Filled 2015-12-13: qty 5.6

## 2015-12-14 ENCOUNTER — Inpatient Hospital Stay: Payer: Medicare Other

## 2015-12-14 ENCOUNTER — Telehealth: Payer: Self-pay | Admitting: *Deleted

## 2015-12-14 VITALS — BP 125/81 | HR 88 | Temp 97.5°F

## 2015-12-14 DIAGNOSIS — D4622 Refractory anemia with excess of blasts 2: Secondary | ICD-10-CM

## 2015-12-14 LAB — CBC WITH DIFFERENTIAL/PLATELET
Basophils Absolute: 0 10*3/uL (ref 0–0.1)
Eosinophils Absolute: 0 10*3/uL (ref 0–0.7)
Eosinophils Relative: 2 %
HEMATOCRIT: 23 % — AB (ref 35.0–47.0)
HEMOGLOBIN: 7.9 g/dL — AB (ref 12.0–16.0)
LYMPHS ABS: 0.4 10*3/uL — AB (ref 1.0–3.6)
Lymphocytes Relative: 60 %
MCH: 34.6 pg — ABNORMAL HIGH (ref 26.0–34.0)
MCHC: 34.3 g/dL (ref 32.0–36.0)
MCV: 100.9 fL — ABNORMAL HIGH (ref 80.0–100.0)
Monocytes Absolute: 0.1 10*3/uL — ABNORMAL LOW (ref 0.2–0.9)
NEUTROS ABS: 0.2 10*3/uL — AB (ref 1.4–6.5)
Platelets: 181 10*3/uL (ref 150–440)
RBC: 2.28 MIL/uL — AB (ref 3.80–5.20)
RDW: 27.2 % — ABNORMAL HIGH (ref 11.5–14.5)
WBC: 0.7 10*3/uL — CL (ref 3.6–11.0)

## 2015-12-14 LAB — SAMPLE TO BLOOD BANK

## 2015-12-14 MED ORDER — AZACITIDINE CHEMO SQ INJECTION
75.0000 mg/m2 | Freq: Once | INTRAMUSCULAR | Status: AC
Start: 2015-12-14 — End: 2015-12-14
  Administered 2015-12-14: 140 mg via SUBCUTANEOUS
  Filled 2015-12-14: qty 5.6

## 2015-12-14 MED ORDER — ONDANSETRON HCL 4 MG PO TABS
8.0000 mg | ORAL_TABLET | Freq: Once | ORAL | Status: AC
  Administered 2015-12-14: 8 mg via ORAL
  Filled 2015-12-14: qty 2

## 2015-12-14 NOTE — Telephone Encounter (Signed)
Critical Lab - ANC 0.2.  MD notified. 

## 2015-12-14 NOTE — Telephone Encounter (Signed)
Pt  Will come in at 130 for labs

## 2015-12-14 NOTE — Telephone Encounter (Signed)
Per Dr. Rogue Bussing add patient on for CBC with appointment this afternoon.

## 2015-12-14 NOTE — Telephone Encounter (Signed)
I returned call to pt as the daughter is not on her list for HIPPA. She informed me that she was so exhausted after her shower this morning that she was in tears and they became concerned, feeling that perhaps her hgb has dropped more. It was 8.0 on Monday before tx. She has Vidaza inj this afternoon at 2, but no lab ordered. Do you want to check CBC when she comes in?

## 2015-12-17 ENCOUNTER — Inpatient Hospital Stay: Payer: Medicare Other

## 2015-12-17 ENCOUNTER — Other Ambulatory Visit: Payer: Self-pay | Admitting: Family Medicine

## 2015-12-17 VITALS — BP 119/79 | HR 94 | Temp 97.4°F

## 2015-12-17 DIAGNOSIS — D6489 Other specified anemias: Secondary | ICD-10-CM

## 2015-12-17 DIAGNOSIS — D4622 Refractory anemia with excess of blasts 2: Secondary | ICD-10-CM

## 2015-12-17 LAB — CBC WITH DIFFERENTIAL/PLATELET
BASOS ABS: 0 10*3/uL (ref 0–0.1)
BASOS PCT: 0 %
EOS ABS: 0 10*3/uL (ref 0–0.7)
EOS PCT: 1 %
HCT: 21.8 % — ABNORMAL LOW (ref 35.0–47.0)
Hemoglobin: 7.5 g/dL — ABNORMAL LOW (ref 12.0–16.0)
LYMPHS ABS: 0.6 10*3/uL — AB (ref 1.0–3.6)
Lymphocytes Relative: 78 %
MCH: 34.4 pg — ABNORMAL HIGH (ref 26.0–34.0)
MCHC: 34.3 g/dL (ref 32.0–36.0)
MCV: 100.3 fL — AB (ref 80.0–100.0)
Monocytes Absolute: 0 10*3/uL — ABNORMAL LOW (ref 0.2–0.9)
Monocytes Relative: 5 %
NEUTROS PCT: 15 %
Neutro Abs: 0.1 10*3/uL — ABNORMAL LOW (ref 1.7–7.7)
PLATELETS: 142 10*3/uL — AB (ref 150–440)
RBC: 2.18 MIL/uL — AB (ref 3.80–5.20)
RDW: 25.2 % — AB (ref 11.5–14.5)
WBC: 0.7 10*3/uL — AB (ref 3.6–11.0)

## 2015-12-17 LAB — BASIC METABOLIC PANEL
ANION GAP: 6 (ref 5–15)
BUN: 18 mg/dL (ref 6–20)
CALCIUM: 9 mg/dL (ref 8.9–10.3)
CO2: 26 mmol/L (ref 22–32)
Chloride: 104 mmol/L (ref 101–111)
Creatinine, Ser: 0.69 mg/dL (ref 0.44–1.00)
Glucose, Bld: 131 mg/dL — ABNORMAL HIGH (ref 65–99)
POTASSIUM: 3.8 mmol/L (ref 3.5–5.1)
SODIUM: 136 mmol/L (ref 135–145)

## 2015-12-17 LAB — ABO/RH: ABO/RH(D): O POS

## 2015-12-17 LAB — PREPARE RBC (CROSSMATCH)

## 2015-12-17 MED ORDER — ONDANSETRON HCL 4 MG PO TABS
8.0000 mg | ORAL_TABLET | Freq: Once | ORAL | Status: AC
  Administered 2015-12-17: 8 mg via ORAL
  Filled 2015-12-17: qty 2

## 2015-12-17 MED ORDER — AZACITIDINE CHEMO SQ INJECTION
75.0000 mg/m2 | Freq: Once | INTRAMUSCULAR | Status: AC
  Administered 2015-12-17: 140 mg via SUBCUTANEOUS
  Filled 2015-12-17: qty 5.6

## 2015-12-17 NOTE — Progress Notes (Unsigned)
Patient is very lethargic, VSS, reviewed labs with Kathryn Boyd, Hgb is trending down, patient to have a unit of blood tomorrow

## 2015-12-17 NOTE — Progress Notes (Signed)
[place]  Telephone:(336) [phone] Fax:(336) [phone]  ID: Kathryn Boyd OB: 1941/03/08  MR#: [id number]  NOM#:[id number]  Patient Care Team: Ezequiel Kayser, MD as PCP - General (Internal Medicine)  CHIEF COMPLAINT:  Chief Complaint  Patient presents with  . RAEB-2    INTERVAL HISTORY: Patient returns to clinic today for further evaluation and initiation of subcutaneous azacitidine. Patient continues to have good days and bad days with regard to her fatigue. She had a low-grade fever recently which resolved with Tylenol. She denies any night sweats or weight loss. She denies any repeated infections. She has no neurologic complaints. She denies any chest pain, shortness of breath, cough, or hemoptysis. She denies any abdominal pain. She has no nausea, vomiting, constipation, or diarrhea. She has no urinary complaints. Patient offers no further specific complaints today.  REVIEW OF SYSTEMS:   Review of Systems  Constitutional: Positive for malaise/fatigue. Negative for fever, chills, weight loss and diaphoresis.  Respiratory: Negative.  Negative for cough and shortness of breath.   Cardiovascular: Negative.  Negative for chest pain.  Gastrointestinal: Negative.  Negative for blood in stool and melena.  Genitourinary: Negative.  Negative for dysuria.  Neurological: Negative.  Negative for weakness.  Psychiatric/Behavioral: Negative.     As per HPI. Otherwise, a complete review of systems is negatve.  PAST MEDICAL HISTORY: Past Medical History  Diagnosis Date  . Cancer ([place])     skin ca  . GERD (gastroesophageal reflux disease)   . Hyperlipidemia   . Osteopenia   . 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 PONV (postoperative nausea and vomiting)   . Arthritis   . Anemia     PAST SURGICAL HISTORY: Past Surgical History  Procedure Laterality Date  . Colonoscopy  1999  . Laparoscopic salpingoopherectomy  1968  .  Functional endoscopic sinus surgery  2007  . Abdominal surgery  1968    Exploratory. Turned out tubal pregnancy  . Tubal ligation      FAMILY HISTORY Family History  Problem Relation Age of Onset  . Breast cancer Maternal Aunt 65  . Breast cancer Paternal Aunt 55  . Lung cancer Father   . Breast cancer Maternal Aunt   . Heart disease Mother   . Stroke Mother   . Breast cancer Paternal Aunt   . Breast cancer Paternal Aunt   . Colon polyps Sister   . Heart disease Sister   . Rheum arthritis Sister        ADVANCED DIRECTIVES:    HEALTH MAINTENANCE: Social History  Substance Use Topics  . Smoking status: Former Smoker    Quit date: 11/19/1976  . Smokeless tobacco: Never Used  . Alcohol Use: Yes     Comment: rarely     Colonoscopy:  PAP:  Bone density:  Lipid panel:  Allergies  Allergen Reactions  . Codeine Nausea And Vomiting  . Lubiprostone Other (See Comments)    Abdominal cramps  . Statins Other (See Comments)    Current Outpatient Prescriptions  Medication Sig Dispense Refill  . aspirin EC 81 MG tablet Take by mouth.    . docusate sodium (COLACE) 100 MG capsule Take 100 mg by mouth 2 (two) times daily. Reported on 11/27/2015    . fluticasone (FLONASE) 50 MCG/ACT nasal spray Place into the nose.    . loratadine (CLARITIN) 10 MG tablet Take 10 mg by mouth daily as needed for allergies.    Marland Kitchen  omeprazole (PRILOSEC) 20 MG capsule     . ondansetron (ZOFRAN) 8 MG tablet Take 1 tablet (8 mg total) by mouth 2 (two) times daily as needed (Nausea or vomiting). 30 tablet 1  . polyethylene glycol powder (GLYCOLAX/MIRALAX) powder Take by mouth. Reported on 11/27/2015    . prochlorperazine (COMPAZINE) 10 MG tablet Take 1 tablet (10 mg total) by mouth every 6 (six) hours as needed (Nausea or vomiting). 30 tablet 1   No current facility-administered medications for this visit.    OBJECTIVE: Filed Vitals:   12/10/15 1418  BP: 144/84  Pulse: 102  Temp: 99.1 F (37.3 C)    Resp: 16     Body mass index is 27.95 kg/(m^2).    ECOG FS:1 - Symptomatic but completely ambulatory  General: Well-developed, well-nourished, no acute distress. Eyes: Pink conjunctiva, anicteric sclera. Lungs: Clear to auscultation bilaterally. Heart: Regular rate and rhythm. No rubs, murmurs, or gallops. Abdomen: Soft, nontender, nondistended. No organomegaly noted, normoactive bowel sounds. Musculoskeletal: No edema, cyanosis, or clubbing. Neuro: Alert, answering all questions appropriately. Cranial nerves grossly intact. Skin: No rashes or petechiae noted. Psych: Normal affect.   LAB RESULTS:  Lab Results  Component Value Date   NA 136 12/10/2015   K 3.8 12/10/2015   CL 103 12/10/2015   CO2 25 12/10/2015   GLUCOSE 152* 12/10/2015   BUN 15 12/10/2015   CREATININE 0.83 12/10/2015   CALCIUM 8.8* 12/10/2015   PROT 7.3 03/04/2012   ALBUMIN 3.7 03/04/2012   AST 32 03/04/2012   ALT 35 03/04/2012   ALKPHOS 99 03/04/2012   BILITOT 0.3 03/04/2012   GFRNONAA >60 12/10/2015   GFRAA >60 12/10/2015    Lab Results  Component Value Date   WBC 0.7* 12/14/2015   NEUTROABS 0.2* 12/14/2015   HGB 7.9* 12/14/2015   HCT 23.0* 12/14/2015   MCV 100.9* 12/14/2015   PLT 181 12/14/2015     STUDIES: Ct Biopsy  11/20/2015  INDICATION: Neutropenia EXAM: CT BIOPSY MEDICATIONS: None. ANESTHESIA/SEDATION: Moderate (conscious) sedation was employed during this procedure. A total of Versed 1 mg and Fentanyl 25 mcg was administered intravenously. Moderate Sedation Time: 16 minutes. The patient's level of consciousness and vital signs were monitored continuously by radiology nursing throughout the procedure under my direct supervision. FLUOROSCOPY TIME:  Not applicable COMPLICATIONS: None immediate. PROCEDURE: Informed written consent was obtained from the patient after a thorough discussion of the procedural risks, benefits and alternatives. All questions were addressed. Maximal Sterile Barrier  Technique was utilized including caps, mask, sterile gowns, sterile gloves, sterile drape, hand hygiene and skin antiseptic. A timeout was performed prior to the initiation of the procedure. Following this the patient was placed on the CT table in the prone position. Initial imaging was performed to localize the left iliac bone. An appropriate area was marked and the skin was prepped in the standard sterile manner utilizing chlorhexidine. Following this utilizing 0.25% Marcaine and CT fluoroscopic guidance, a bone marrow biopsy needle was placed percutaneously into the left iliac bone. Aspiration was then performed and deemed the less than adequate by the cytotechnologist. Additional samples were obtained and also not felt to be of an adequate nature. Subsequently, a bone core was obtained. A second core was obtained due to the limited nature of the bone marrow aspiration. These were both felt to be satisfactory. Needles were then removed. Hemostasis was obtained at the puncture site. The patient tolerated the procedure well was returned to her room in satisfactory condition. IMPRESSION: Successful CT-guided  bone marrow biopsy and aspiration. Electronically Signed   By: Inez Catalina M.D.   On: 11/20/2015 09:42    ASSESSMENT: MDS, specifically refractory anemia with excess blasts-2.   PLAN:    1. 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 Patient has approximately 15% blasts in her bone marrow. She was also noted to have multilineage dyspoiesis and atypical megakaryocytes. Patient noted to have multiple cytogenetic abnormalities placing her at high risk for conversion to acute leukemia. Proceed with cycle 1 of subcutaneous Vidaza 30m/m2/d on days 1 through 5 and days 8 and 9 on a 28 day cycle. Return to clinic in 2 weeks with repeat laboratory work and further evaluation. [facility] if patient does convert to acute  leukemia. 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 Patient will also require irradiated blood products.  Approximately 30 minutes was spent in discussion of which greater than 50% was consultation.  Patient expressed understanding and was in agreement with this plan. She also understands that She can call clinic at any time with any questions, concerns, or complaints.    TLloyd Huger MD   12/17/2015 11:55 AM

## 2015-12-18 ENCOUNTER — Inpatient Hospital Stay: Payer: Medicare Other

## 2015-12-18 VITALS — BP 117/76 | HR 97 | Temp 96.9°F | Resp 18

## 2015-12-18 DIAGNOSIS — D4622 Refractory anemia with excess of blasts 2: Secondary | ICD-10-CM | POA: Diagnosis not present

## 2015-12-18 MED ORDER — DIPHENHYDRAMINE HCL 25 MG PO CAPS
25.0000 mg | ORAL_CAPSULE | Freq: Once | ORAL | Status: AC
  Administered 2015-12-18: 25 mg via ORAL
  Filled 2015-12-18: qty 1

## 2015-12-18 MED ORDER — ONDANSETRON HCL 4 MG PO TABS
8.0000 mg | ORAL_TABLET | Freq: Once | ORAL | Status: AC
  Administered 2015-12-18: 8 mg via ORAL
  Filled 2015-12-18: qty 2

## 2015-12-18 MED ORDER — SODIUM CHLORIDE 0.9 % IV SOLN
250.0000 mL | Freq: Once | INTRAVENOUS | Status: AC
  Administered 2015-12-18: 250 mL via INTRAVENOUS
  Filled 2015-12-18: qty 250

## 2015-12-18 MED ORDER — ACETAMINOPHEN 325 MG PO TABS
650.0000 mg | ORAL_TABLET | Freq: Once | ORAL | Status: AC
  Administered 2015-12-18: 650 mg via ORAL
  Filled 2015-12-18: qty 2

## 2015-12-18 MED ORDER — AZACITIDINE CHEMO SQ INJECTION
75.0000 mg/m2 | Freq: Once | INTRAMUSCULAR | Status: AC
  Administered 2015-12-18: 140 mg via SUBCUTANEOUS
  Filled 2015-12-18: qty 5.6

## 2015-12-19 ENCOUNTER — Ambulatory Visit: Payer: Medicare Other

## 2015-12-20 ENCOUNTER — Telehealth: Payer: Self-pay | Admitting: *Deleted

## 2015-12-20 LAB — TYPE AND SCREEN
ABO/RH(D): O POS
ANTIBODY SCREEN: NEGATIVE
UNIT DIVISION: 0

## 2015-12-20 MED ORDER — HYDROCORTISONE 2.5 % RE CREA: 1.0000 "application " | TOPICAL_CREAM | Freq: Two times a day (BID) | RECTAL | Status: AC

## 2015-12-20 NOTE — Telephone Encounter (Signed)
Per Dr Grayland Ormond, Anusol [facility] cream e scribed

## 2015-12-20 NOTE — Telephone Encounter (Signed)
Asking for hemorrhoid med be called in to [facility] because Preparation H is not helping.

## 2015-12-21 ENCOUNTER — Other Ambulatory Visit: Payer: Self-pay | Admitting: *Deleted

## 2015-12-21 DIAGNOSIS — D4622 Refractory anemia with excess of blasts 2: Secondary | ICD-10-CM

## 2015-12-24 ENCOUNTER — Inpatient Hospital Stay: Payer: Medicare Other

## 2015-12-24 ENCOUNTER — Other Ambulatory Visit: Payer: Self-pay | Admitting: Oncology

## 2015-12-24 ENCOUNTER — Inpatient Hospital Stay (HOSPITAL_BASED_OUTPATIENT_CLINIC_OR_DEPARTMENT_OTHER): Payer: Medicare Other | Admitting: Oncology

## 2015-12-24 VITALS — BP 130/78 | HR 114 | Temp 98.1°F | Resp 18 | Wt 162.9 lb

## 2015-12-24 DIAGNOSIS — R531 Weakness: Secondary | ICD-10-CM

## 2015-12-24 DIAGNOSIS — E785 Hyperlipidemia, unspecified: Secondary | ICD-10-CM

## 2015-12-24 DIAGNOSIS — Z79899 Other long term (current) drug therapy: Secondary | ICD-10-CM

## 2015-12-24 DIAGNOSIS — Z7982 Long term (current) use of aspirin: Secondary | ICD-10-CM

## 2015-12-24 DIAGNOSIS — Z87891 Personal history of nicotine dependence: Secondary | ICD-10-CM

## 2015-12-24 DIAGNOSIS — D4622 Refractory anemia with excess of blasts 2: Secondary | ICD-10-CM

## 2015-12-24 DIAGNOSIS — D471 Chronic myeloproliferative disease: Secondary | ICD-10-CM | POA: Diagnosis not present

## 2015-12-24 DIAGNOSIS — G4700 Insomnia, unspecified: Secondary | ICD-10-CM

## 2015-12-24 DIAGNOSIS — K649 Unspecified hemorrhoids: Secondary | ICD-10-CM | POA: Diagnosis not present

## 2015-12-24 DIAGNOSIS — E049 Nontoxic goiter, unspecified: Secondary | ICD-10-CM

## 2015-12-24 DIAGNOSIS — D469 Myelodysplastic syndrome, unspecified: Secondary | ICD-10-CM

## 2015-12-24 DIAGNOSIS — Z85828 Personal history of other malignant neoplasm of skin: Secondary | ICD-10-CM

## 2015-12-24 DIAGNOSIS — D61818 Other pancytopenia: Secondary | ICD-10-CM

## 2015-12-24 DIAGNOSIS — R5383 Other fatigue: Secondary | ICD-10-CM

## 2015-12-24 DIAGNOSIS — Z800 Family history of malignant neoplasm of digestive organs: Secondary | ICD-10-CM

## 2015-12-24 DIAGNOSIS — Z803 Family history of malignant neoplasm of breast: Secondary | ICD-10-CM

## 2015-12-24 DIAGNOSIS — M818 Other osteoporosis without current pathological fracture: Secondary | ICD-10-CM

## 2015-12-24 DIAGNOSIS — M129 Arthropathy, unspecified: Secondary | ICD-10-CM

## 2015-12-24 DIAGNOSIS — K219 Gastro-esophageal reflux disease without esophagitis: Secondary | ICD-10-CM

## 2015-12-24 LAB — BASIC METABOLIC PANEL
ANION GAP: 5 (ref 5–15)
BUN: 13 mg/dL (ref 6–20)
CO2: 24 mmol/L (ref 22–32)
Calcium: 8.7 mg/dL — ABNORMAL LOW (ref 8.9–10.3)
Chloride: 105 mmol/L (ref 101–111)
Creatinine, Ser: 0.75 mg/dL (ref 0.44–1.00)
GFR calc non Af Amer: >60 mL/min (ref >60)
GLUCOSE: 166 mg/dL — AB (ref 65–99)
POTASSIUM: 4.2 mmol/L (ref 3.5–5.1)
Sodium: 134 mmol/L — ABNORMAL LOW (ref 135–145)

## 2015-12-24 LAB — CBC WITH DIFFERENTIAL/PLATELET
BASOS ABS: 0 10*3/uL (ref 0–0.1)
Basophils Relative: 0 %
Eosinophils Absolute: 0 10*3/uL (ref 0–0.7)
Eosinophils Relative: 1 %
HEMATOCRIT: 22 % — AB (ref 35.0–47.0)
HEMOGLOBIN: 7.6 g/dL — AB (ref 12.0–16.0)
Lymphs Abs: 0.3 10*3/uL — ABNORMAL LOW (ref 1.0–3.6)
MCH: 33 pg (ref 26.0–34.0)
MCHC: 34.3 g/dL (ref 32.0–36.0)
MCV: 96.2 fL (ref 80.0–100.0)
Monocytes Absolute: 0.1 10*3/uL — ABNORMAL LOW (ref 0.2–0.9)
Monocytes Relative: 23 %
NEUTROS ABS: 0.1 10*3/uL — AB (ref 1.4–6.5)
Platelets: 69 10*3/uL — ABNORMAL LOW (ref 150–440)
RBC: 2.29 MIL/uL — AB (ref 3.80–5.20)
RDW: 25.6 % — ABNORMAL HIGH (ref 11.5–14.5)
WBC: 0.6 10*3/uL — AB (ref 3.6–11.0)

## 2015-12-24 LAB — SAMPLE TO BLOOD BANK

## 2015-12-24 LAB — PREPARE RBC (CROSSMATCH)

## 2015-12-24 NOTE — Progress Notes (Signed)
Patient reports her main concern today is the hemorrhoids that seem to be getting worse.  She took some medication for constipation which has given her frequent loose stools that is aggrivating the hemorrhoids.  Everyday she feels chilled and temp has been around 99.3.  Feels "shaky on the inside" and does have palpitations when she is stands after sitting on the toilet.  Is very tired today but she contributes this to not sleeping well last night due to the loose bowels.

## 2015-12-26 ENCOUNTER — Telehealth: Payer: Self-pay | Admitting: *Deleted

## 2015-12-26 ENCOUNTER — Inpatient Hospital Stay
Admission: EM | Admit: 2015-12-26 | Discharge: 2015-12-27 | DRG: 690 | Disposition: A | Discharge status: Home / Self Care | Payer: Medicare Other | Attending: Internal Medicine | Admitting: Internal Medicine

## 2015-12-26 ENCOUNTER — Other Ambulatory Visit: Payer: Self-pay

## 2015-12-26 ENCOUNTER — Inpatient Hospital Stay: Payer: Medicare Other

## 2015-12-26 ENCOUNTER — Encounter: Payer: Self-pay | Admitting: Emergency Medicine

## 2015-12-26 DIAGNOSIS — Z87891 Personal history of nicotine dependence: Secondary | ICD-10-CM | POA: Diagnosis not present

## 2015-12-26 DIAGNOSIS — T451X5A Adverse effect of antineoplastic and immunosuppressive drugs, initial encounter: Secondary | ICD-10-CM | POA: Diagnosis present

## 2015-12-26 DIAGNOSIS — B9620 Unspecified Escherichia coli [E. coli] as the cause of diseases classified elsewhere: Secondary | ICD-10-CM | POA: Diagnosis present

## 2015-12-26 DIAGNOSIS — Z823 Family history of stroke: Secondary | ICD-10-CM

## 2015-12-26 DIAGNOSIS — E785 Hyperlipidemia, unspecified: Secondary | ICD-10-CM | POA: Diagnosis present

## 2015-12-26 DIAGNOSIS — Z8249 Family history of ischemic heart disease and other diseases of the circulatory system: Secondary | ICD-10-CM

## 2015-12-26 DIAGNOSIS — Z8261 Family history of arthritis: Secondary | ICD-10-CM

## 2015-12-26 DIAGNOSIS — R197 Diarrhea, unspecified: Secondary | ICD-10-CM | POA: Diagnosis not present

## 2015-12-26 DIAGNOSIS — L03012 Cellulitis of left finger: Secondary | ICD-10-CM | POA: Diagnosis present

## 2015-12-26 DIAGNOSIS — D61818 Other pancytopenia: Secondary | ICD-10-CM | POA: Diagnosis present

## 2015-12-26 DIAGNOSIS — D649 Anemia, unspecified: Secondary | ICD-10-CM

## 2015-12-26 DIAGNOSIS — Y92531 Health care provider office as the place of occurrence of the external cause: Secondary | ICD-10-CM

## 2015-12-26 DIAGNOSIS — Z8371 Family history of colonic polyps: Secondary | ICD-10-CM | POA: Diagnosis not present

## 2015-12-26 DIAGNOSIS — D4622 Refractory anemia with excess of blasts 2: Secondary | ICD-10-CM | POA: Diagnosis present

## 2015-12-26 DIAGNOSIS — Z888 Allergy status to other drugs, medicaments and biological substances status: Secondary | ICD-10-CM

## 2015-12-26 DIAGNOSIS — Z803 Family history of malignant neoplasm of breast: Secondary | ICD-10-CM

## 2015-12-26 DIAGNOSIS — Z801 Family history of malignant neoplasm of trachea, bronchus and lung: Secondary | ICD-10-CM | POA: Diagnosis not present

## 2015-12-26 DIAGNOSIS — K219 Gastro-esophageal reflux disease without esophagitis: Secondary | ICD-10-CM | POA: Diagnosis present

## 2015-12-26 DIAGNOSIS — M8580 Other specified disorders of bone density and structure, unspecified site: Secondary | ICD-10-CM | POA: Diagnosis present

## 2015-12-26 DIAGNOSIS — Z885 Allergy status to narcotic agent status: Secondary | ICD-10-CM

## 2015-12-26 DIAGNOSIS — N390 Urinary tract infection, site not specified: Principal | ICD-10-CM | POA: Diagnosis present

## 2015-12-26 DIAGNOSIS — Z85828 Personal history of other malignant neoplasm of skin: Secondary | ICD-10-CM

## 2015-12-26 DIAGNOSIS — D709 Neutropenia, unspecified: Secondary | ICD-10-CM | POA: Diagnosis present

## 2015-12-26 DIAGNOSIS — D469 Myelodysplastic syndrome, unspecified: Secondary | ICD-10-CM | POA: Diagnosis not present

## 2015-12-26 HISTORY — DX: Myelodysplastic syndrome, unspecified: D46.9

## 2015-12-26 LAB — CBC WITH DIFFERENTIAL/PLATELET
BAND NEUTROPHILS: 0 %
BASOS ABS: 0 10*3/uL (ref 0–0.1)
BLASTS: 0 %
Basophils Relative: 0 %
EOS ABS: 0 10*3/uL (ref 0–0.7)
Eosinophils Relative: 0 %
HEMATOCRIT: 20.2 % — AB (ref 35.0–47.0)
Hemoglobin: 6.7 g/dL — ABNORMAL LOW (ref 12.0–16.0)
Lymphocytes Relative: 76 %
Lymphs Abs: 0.6 10*3/uL — ABNORMAL LOW (ref 1.0–3.6)
MCH: 32.1 pg (ref 26.0–34.0)
MCHC: 33 g/dL (ref 32.0–36.0)
MCV: 97.5 fL (ref 80.0–100.0)
METAMYELOCYTES PCT: 0 %
Monocytes Absolute: 0.1 10*3/uL — ABNORMAL LOW (ref 0.2–0.9)
Monocytes Relative: 8 %
Myelocytes: 0 %
Neutro Abs: 0.1 10*3/uL — ABNORMAL LOW (ref 1.4–6.5)
Neutrophils Relative %: 16 %
Other: 0 %
PLATELETS: 57 10*3/uL — AB (ref 150–440)
PROMYELOCYTES ABS: 0 %
RBC: 2.07 MIL/uL — ABNORMAL LOW (ref 3.80–5.20)
RDW: 25.5 % — ABNORMAL HIGH (ref 11.5–14.5)
WBC: 0.8 10*3/uL — CL (ref 3.6–11.0)
nRBC: 0 /100 WBC

## 2015-12-26 LAB — TYPE AND SCREEN
ABO/RH(D): O POS
Antibody Screen: NEGATIVE
UNIT DIVISION: 0

## 2015-12-26 LAB — URINALYSIS COMPLETE WITH MICROSCOPIC (ARMC ONLY)
BILIRUBIN URINE: NEGATIVE
GLUCOSE, UA: NEGATIVE mg/dL
Ketones, ur: NEGATIVE mg/dL
NITRITE: POSITIVE — AB
Protein, ur: 30 mg/dL — AB
SPECIFIC GRAVITY, URINE: 1.016 (ref 1.005–1.030)
pH: 5 (ref 5.0–8.0)

## 2015-12-26 LAB — COMPREHENSIVE METABOLIC PANEL
ALK PHOS: 78 U/L (ref 38–126)
ALT: 17 U/L (ref 14–54)
ANION GAP: 7 (ref 5–15)
AST: 17 U/L (ref 15–41)
Albumin: 3.4 g/dL — ABNORMAL LOW (ref 3.5–5.0)
BILIRUBIN TOTAL: 0.5 mg/dL (ref 0.3–1.2)
BUN: 11 mg/dL (ref 6–20)
CALCIUM: 8.8 mg/dL — AB (ref 8.9–10.3)
CO2: 24 mmol/L (ref 22–32)
Chloride: 104 mmol/L (ref 101–111)
Creatinine, Ser: 0.68 mg/dL (ref 0.44–1.00)
GFR calc non Af Amer: >60 mL/min (ref >60)
Glucose, Bld: 120 mg/dL — ABNORMAL HIGH (ref 65–99)
Potassium: 3.8 mmol/L (ref 3.5–5.1)
SODIUM: 135 mmol/L (ref 135–145)
TOTAL PROTEIN: 6.9 g/dL (ref 6.5–8.1)

## 2015-12-26 LAB — LACTIC ACID, PLASMA
LACTIC ACID, VENOUS: 0.9 mmol/L (ref 0.5–2.0)
LACTIC ACID, VENOUS: 1.7 mmol/L (ref 0.5–2.0)

## 2015-12-26 LAB — PREPARE RBC (CROSSMATCH)

## 2015-12-26 LAB — TROPONIN I

## 2015-12-26 MED ORDER — PROCHLORPERAZINE MALEATE 10 MG PO TABS: 10.0000 mg | ORAL_TABLET | Freq: Four times a day (QID) | ORAL | Status: DC | PRN

## 2015-12-26 MED ORDER — SODIUM CHLORIDE 0.9 % IV SOLN
Freq: Once | INTRAVENOUS | Status: AC
  Administered 2015-12-27: 14:00:00 via INTRAVENOUS

## 2015-12-26 MED ORDER — HYDROCORTISONE 2.5 % RE CREA
1.0000 | TOPICAL_CREAM | Freq: Two times a day (BID) | RECTAL | Status: DC
Start: 2015-12-26 — End: 2015-12-27
  Filled 2015-12-26: qty 28.35

## 2015-12-26 MED ORDER — CEFTRIAXONE SODIUM 1 G IJ SOLR
1.0000 g | Freq: Once | INTRAMUSCULAR | Status: AC
  Administered 2015-12-26: 1 g via INTRAVENOUS
  Filled 2015-12-26: qty 10

## 2015-12-26 MED ORDER — FLUTICASONE PROPIONATE 50 MCG/ACT NA SUSP: 1.0000 | Freq: Every day | NASAL | Status: DC

## 2015-12-26 MED ORDER — LORATADINE 10 MG PO TABS: 10.0000 mg | ORAL_TABLET | Freq: Every day | ORAL | Status: DC | PRN

## 2015-12-26 MED ORDER — POLYETHYLENE GLYCOL 3350 17 GM/SCOOP PO POWD
17.0000 g | Freq: Every day | ORAL | Status: DC | PRN
  Filled 2015-12-26: qty 255

## 2015-12-26 MED ORDER — ONDANSETRON HCL 4 MG PO TABS
8.0000 mg | ORAL_TABLET | Freq: Two times a day (BID) | ORAL | Status: DC | PRN
  Filled 2015-12-26: qty 2

## 2015-12-26 MED ORDER — DEXTROSE 5 % IV SOLN
1.0000 g | INTRAVENOUS | Status: DC
  Administered 2015-12-27: 14:00:00 1 g via INTRAVENOUS
  Filled 2015-12-26 (×2): qty 10

## 2015-12-26 MED ORDER — ASPIRIN EC 81 MG PO TBEC
81.0000 mg | DELAYED_RELEASE_TABLET | Freq: Every day | ORAL | Status: DC
  Administered 2015-12-26 – 2015-12-27 (×2): 81 mg via ORAL
  Filled 2015-12-26 (×2): qty 1

## 2015-12-26 MED ORDER — PANTOPRAZOLE SODIUM 40 MG PO TBEC
40.0000 mg | DELAYED_RELEASE_TABLET | Freq: Every day | ORAL | Status: DC
  Administered 2015-12-27: 40 mg via ORAL
  Filled 2015-12-26: qty 1

## 2015-12-26 MED ORDER — DOCUSATE SODIUM 100 MG PO CAPS
100.0000 mg | ORAL_CAPSULE | Freq: Two times a day (BID) | ORAL | Status: DC
  Administered 2015-12-27: 12:00:00 100 mg via ORAL
  Filled 2015-12-26: qty 1

## 2015-12-26 NOTE — Telephone Encounter (Signed)
Called to state she has an appt at 130, but she is not weel and needs to come for blood now or they will have to take her to ER. I advised she will need to go to ER if it must be now, we do not have an infusion chair available until 130 for her. She stated they will take her to ER

## 2015-12-26 NOTE — ED Notes (Signed)
Pt presents with weakness and diarrhea started today. Pt hx of cancer supposed to have blood transfusion today in the cancer center. Pt HGB on Monday was 7.2.

## 2015-12-26 NOTE — ED Provider Notes (Signed)
[facility] Emergency Department Provider Note   ___________________________________________  Time seen:  I have reviewed the triage vital signs and the triage nursing note.  HISTORY  Chief Complaint Diarrhea and Anemia   Historian Patient and family members  HPI ZYKIRIA GUTTILLA is a 75 y.o. 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  She was supposed to have an outpatient blood transfusion at the cancer center today, but she woke up today and was too weak to barely walk, and had one episode of nonbloody diarrhea. She was so weak she can hardly stand. Denies fevers.  Denies abdominal pain, chest pain, palpitations, or urinary symptoms. She has had a small bump on her left index finger for a few days which popped and drained pus and there is still some redness to the skin over the top of that finger. No pain with grip or flexion.    Past Medical History  Diagnosis Date  . Cancer ([place])     skin ca  . GERD (gastroesophageal reflux disease)   . Hyperlipidemia   . Osteopenia   . 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 PONV (postoperative nausea and vomiting)   . Arthritis   . Anemia     Patient Active Problem List   Diagnosis Date Noted  . 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 Colonoscopy  1999  . Laparoscopic salpingoopherectomy  1968  . Functional endoscopic sinus surgery  2007  . Abdominal surgery  1968    Exploratory. Turned out tubal pregnancy  . Tubal ligation      Current Outpatient Rx  Name  Route  Sig  Dispense  Refill  . aspirin EC 81 MG tablet   Oral   Take 81 mg by mouth daily.          Marland Kitchen docusate sodium (COLACE) 100 MG capsule    Oral   Take 100 mg by mouth 2 (two) times daily. Reported on 11/27/2015         . fluticasone (FLONASE) 50 MCG/ACT nasal spray   Nasal   Place 1-2 sprays into the nose daily as needed for allergies or rhinitis.          . hydrocortisone (ANUSOL-HC) 2.5 % rectal cream   Rectal   Place 1 application rectally 2 (two) times daily.   30 g   2   . loratadine (CLARITIN) 10 MG tablet   Oral   Take 10 mg by mouth daily as needed for allergies.         Marland Kitchen omeprazole (PRILOSEC) 20 MG capsule   Oral   Take 20 mg by mouth daily.          . ondansetron (ZOFRAN) 8 MG tablet   Oral   Take 1 tablet (8 mg total) by mouth 2 (two) times daily as needed (Nausea or vomiting).   30 tablet   1   . polyethylene glycol powder (GLYCOLAX/MIRALAX) powder   Oral   Take 17 g by mouth daily as needed. Reported on 11/27/2015         . prochlorperazine (COMPAZINE) 10 MG tablet   Oral   Take 1 tablet (10 mg total) by mouth every 6 (six)  hours as needed (Nausea or vomiting).   30 tablet   1     Allergies Codeine; Lubiprostone; and Statins  Family History  Problem Relation Age of Onset  . Breast cancer Maternal Aunt 65  . Breast cancer Paternal Aunt 38  . Lung cancer Father   . Breast cancer Maternal Aunt   . Heart disease Mother   . Stroke Mother   . Breast cancer Paternal Aunt   . Breast cancer Paternal Aunt   . Colon polyps Sister   . Heart disease Sister   . Rheum arthritis Sister     Social History Social History  Substance Use Topics  . Smoking status: Former Smoker    Quit date: 11/19/1976  . Smokeless tobacco: Never Used  . Alcohol Use: Yes     Comment: rarely    Review of Systems  Constitutional: Negative for fever. Eyes: Negative for visual changes. ENT: Negative for sore throat. Cardiovascular: Negative for chest pain. Respiratory:Positive for chronic exertional dyspnea. Gastrointestinal: Negative for vomiting Genitourinary: Negative for  dysuria. Musculoskeletal: Negative for back pain. Skin: Negative for rash. Neurological: Negative for headache. 10 point Review of Systems otherwise negative ____________________________________________   PHYSICAL EXAM:  VITAL SIGNS: ED Triage Vitals  Enc Vitals Group     BP 12/26/15 1150 117/63 mmHg     Pulse Rate 12/26/15 1148 108     Resp 12/26/15 1148 18     Temp 12/26/15 1148 99.5 F (37.5 C)     Temp Source 12/26/15 1148 Oral     SpO2 12/26/15 1148 100 %     Weight 12/26/15 1148 158 lb (71.668 kg)     Height 12/26/15 1148 5\' 4"  (1.626 m)     Head Cir --      Peak Flow --      Pain Score --      Pain Loc --      Pain Edu? --      Excl. in [place]? --      Constitutional: Alert and oriented. Well appearing Overall but appears to feel unwell are very weak. HEENT   Head: Normocephalic and atraumatic.      Eyes: Conjunctivae are normal. PERRL. Normal extraocular movements.      Ears:         Nose: No congestion/rhinnorhea.   Mouth/Throat: Mucous membranes are mildly dry.   Neck: No stridor. Cardiovascular/Chest: Normal rate, regular rhythm.  No murmurs, rubs, or gallops. Respiratory: Normal respiratory effort without tachypnea nor retractions. Breath sounds are clear and equal bilaterally. No wheezes/rales/rhonchi. Gastrointestinal: Soft. No distention, no guarding, no rebound. Nontender.    Genitourinary/rectal:Deferred Musculoskeletal: Nontender with normal range of motion in all extremities. No joint effusions.  No lower extremity tenderness.  No edema. Left index finger mildly swollen at the PIP joint, but able to flex without discomfort. There is some erythema from the PIP to the MCP area. Neurologic:  Normal speech and language. No gross or focal neurologic deficits are appreciated. Skin:  Skin is warm, dry and intact. Cellulitis/erythema of the left index finger. Psychiatric: Mood and affect are normal. Speech and behavior are normal. Patient exhibits  appropriate insight and judgment.  ____________________________________________   EKG I, Lisa Roca, MD, the attending physician have personally viewed and interpreted all ECGs.  105 bpm. Sinus tachycardia. Narrow QRS. Normal axis. Normal ST and T-wave ____________________________________________  LABS (pertinent positives/negatives)  Labs Reviewed  URINALYSIS COMPLETEWITH MICROSCOPIC ([facility] ONLY) - Abnormal; Notable for the following:  Color, Urine YELLOW (*)    APPearance CLOUDY (*)    Hgb urine dipstick 2+ (*)    Protein, ur 30 (*)    Nitrite POSITIVE (*)    Leukocytes, UA 3+ (*)    Bacteria, UA MANY (*)    Squamous Epithelial / LPF 0-5 (*)    All other components within normal limits  COMPREHENSIVE METABOLIC PANEL - Abnormal; Notable for the following:    Glucose, Bld 120 (*)    Calcium 8.8 (*)    Albumin 3.4 (*)    All other components within normal limits  CBC WITH DIFFERENTIAL/PLATELET - Abnormal; Notable for the following:    WBC 0.8 (*)    RBC 2.07 (*)    Hemoglobin 6.7 (*)    HCT 20.2 (*)    RDW 25.5 (*)    Platelets 57 (*)    All other components within normal limits  URINE CULTURE  CULTURE, BLOOD (ROUTINE X 2)  CULTURE, BLOOD (ROUTINE X 2)  TROPONIN I  LACTIC ACID, PLASMA  LACTIC ACID, PLASMA    ____________________________________________  RADIOLOGY All Xrays were viewed by me. Imaging interpreted by Radiologist.  None __________________________________________  PROCEDURES  Procedure(s) performed: None  Critical Care performed: None  ____________________________________________   ED COURSE / ASSESSMENT AND PLAN  Pertinent labs & imaging results that were available during my care of the patient were reviewed by me and considered in my medical decision making (see chart for details).   Patient here with symptoms of severe fatigue and chronic dyspnea on exertion, which may all be related to her underlying known anemia due to  myelodysplastic syndrome for which she is supposed to have an outpatient blood transfusion this afternoon.  However, I will check blood work to ensure no acute renal failure, electrolyte disturbance, status of her hemoglobin, and white blood cell count.  Her left index finger does appear to potentially have a tiny cellulitis, no area of fluctuance. No clinical concern for flexor tenosynovitis. I will go ahead and give a dose of doxycycline to cover for cellulitis.  On laboratory evaluation, her white blood cell count was found to be 0.8 which is similar to previous. Her hemoglobin 6.7, slightly down from prior.  No acute renal failure or electrolyte disturbance. Her lactate is normal.  Given her neutropenia, I did discuss this case with the on-call oncologist, Dr.Brahamandy antibiotic recommendations -- recommends IV rocephin and hospital management for UTI and small cellulitis - he did not recommend anything additional.   CONSULTATIONS:  Hospitalist for admission.   Patient / Family / Caregiver informed of clinical course, medical decision-making process, and agree with plan.     ___________________________________________   FINAL CLINICAL IMPRESSION(S) / ED DIAGNOSES   Final diagnoses:  UTI (lower urinary tract infection)  Symptomatic anemia  Neutropenia, unspecified type ([place])  Cellulitis of index finger, left              Note: This dictation was prepared with Dragon dictation. Any transcriptional errors that result from this process are unintentional   Lisa Roca, MD 12/26/15 1433

## 2015-12-26 NOTE — H&P (Signed)
[facility] [place] NAME: Kathryn Boyd    MR#:  [id number]  DATE OF BIRTH:  [birth year]  DATE OF ADMISSION:  12/26/2015  PRIMARY CARE PHYSICIAN: Ezequiel Kayser, MD   REQUESTING/REFERRING PHYSICIAN: Lord  CHIEF COMPLAINT:   Chief Complaint  Patient presents with  . Diarrhea  . Anemia    HISTORY OF PRESENT ILLNESS: Kathryn Boyd  is a 75 y.o. female with a known history of Myelodysplasia, skin cancer, arthritis, hyperlipidemia, chronic anemia- had last chemotherapy last week and on follow-up her hemoglobin was running low today she was supposed to go to [place] to her blood transfusion today. She started feeling very weak and some nausea for last 4-5 days after the level that she could not go to [place] today and decided to come to emergency room. In ER she was noted to have urinary tract infection and her white blood cell count was 800. ER physician spoke to oncologist on call and he suggested to admit the patient with IV antibiotics. He also suggested to transfuse 1 unit of PRBC in the hospital.  PAST MEDICAL HISTORY:   Past Medical History  Diagnosis Date  . Cancer ([place])     skin ca  . GERD (gastroesophageal reflux disease)   . Hyperlipidemia   . Osteopenia   . 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 PONV (postoperative nausea and vomiting)   . Arthritis   . Anemia   . 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 Colonoscopy  1999  . Laparoscopic salpingoopherectomy  1968  . Functional endoscopic sinus surgery  2007  . Abdominal surgery  1968    Exploratory. Turned out tubal pregnancy  . Tubal ligation      SOCIAL HISTORY:  Social History  Substance Use Topics  . Smoking status: Former Smoker    Quit date: 11/19/1976  . Smokeless tobacco: Never Used  . Alcohol Use: Yes     Comment: rarely    FAMILY HISTORY:   Family History  Problem Relation Age of Onset  . Breast cancer Maternal Aunt 65  . Breast cancer Paternal Aunt 35  . Lung cancer Father   . Breast cancer Maternal Aunt   . Heart disease Mother   . Stroke Mother   . Breast cancer Paternal Aunt   . Breast cancer Paternal Aunt   . Colon polyps Sister   . Heart disease Sister   . Rheum arthritis Sister     DRUG ALLERGIES:  Allergies  Allergen Reactions  . Codeine Nausea And Vomiting  . Lubiprostone Other (See Comments)    Abdominal cramps  . Statins Other (See Comments)    REVIEW OF SYSTEMS:   CONSTITUTIONAL: No fever, Positive for fatigue or weakness.  EYES: No blurred or double vision.  EARS, NOSE, AND THROAT: No tinnitus or ear pain.  RESPIRATORY: No cough, shortness of breath, wheezing or hemoptysis.  CARDIOVASCULAR: No chest pain, orthopnea, edema.  GASTROINTESTINAL: Positive nausea, vomiting, no diarrhea or abdominal pain.  GENITOURINARY: No dysuria, hematuria.  ENDOCRINE: No polyuria, nocturia,  HEMATOLOGY: No anemia, easy bruising or bleeding SKIN: No rash or lesion. MUSCULOSKELETAL: No joint pain or arthritis.   NEUROLOGIC: No tingling, numbness, weakness.  PSYCHIATRY: No anxiety or depression.   MEDICATIONS AT HOME:  Prior to Admission medications   Medication Sig Start Date End Date  Taking? Authorizing Provider  aspirin EC 81 MG tablet Take 81 mg by mouth daily.    Yes Historical Provider, MD  docusate sodium (COLACE) 100 MG capsule Take 100 mg by mouth 2 (two) times daily. Reported on 11/27/2015   Yes Historical Provider, MD  fluticasone (FLONASE) 50 MCG/ACT nasal spray Place 1-2 sprays into the nose daily as needed for allergies or rhinitis.  06/14/15  Yes Historical Provider, MD  hydrocortisone (ANUSOL-HC) 2.5 % rectal cream Place 1 application rectally 2 (two) times daily. 12/20/15  Yes Lloyd Huger, MD  loratadine (CLARITIN) 10 MG tablet Take 10 mg by mouth daily as needed for allergies.   Yes  Historical Provider, MD  omeprazole (PRILOSEC) 20 MG capsule Take 20 mg by mouth daily.  11/08/15  Yes Historical Provider, MD  ondansetron (ZOFRAN) 8 MG tablet Take 1 tablet (8 mg total) by mouth 2 (two) times daily as needed (Nausea or vomiting). 12/02/15  Yes Lloyd Huger, MD  polyethylene glycol powder (GLYCOLAX/MIRALAX) powder Take 17 g by mouth daily as needed. Reported on 11/27/2015   Yes Historical Provider, MD  prochlorperazine (COMPAZINE) 10 MG tablet Take 1 tablet (10 mg total) by mouth every 6 (six) hours as needed (Nausea or vomiting). 12/02/15  Yes Lloyd Huger, MD      PHYSICAL EXAMINATION:   VITAL SIGNS: Blood pressure 117/63, pulse 108, temperature 99.5 F (37.5 C), temperature source Oral, resp. rate 18, height 5\' 4"  (1.626 m), weight 71.668 kg (158 lb), SpO2 100 %.  GENERAL:  75 y.o.-year-old patient lying in the bed with no acute distress.  EYES: Pupils equal, round, reactive to light and accommodation. No scleral icterus. Extraocular muscles intact. Conjunctiva pale  HEENT: Head atraumatic, normocephalic. Oropharynx and nasopharynx clear.  NECK:  Supple, no jugular venous distention. No thyroid enlargement, no tenderness.  LUNGS: Normal breath sounds bilaterally, no wheezing, rales,rhonchi or crepitation. No use of accessory muscles of respiration.  CARDIOVASCULAR: S1, S2 normal. No murmurs, rubs, or gallops.  ABDOMEN: Soft, nontender, nondistended. Bowel sounds present. No organomegaly or mass.  EXTREMITIES: No pedal edema, cyanosis, or clubbing. On left index finger there is some redness that her proximal interphalangeal joint with some crusting on top. But she is able to move the finger to the full extent. NEUROLOGIC: Cranial nerves II through XII are intact. Muscle strength 5/5 in all extremities. Sensation intact. Gait not checked.  PSYCHIATRIC: The patient is alert and oriented x 3.  SKIN: No obvious rash, lesion, or ulcer.   LABORATORY PANEL:    CBC  Recent Labs Lab 12/24/15 1011 12/26/15 1220  WBC 0.6* 0.8*  HGB 7.6* 6.7*  HCT 22.0* 20.2*  PLT 69* 57*  MCV 96.2 97.5  MCH 33.0 32.1  MCHC 34.3 33.0  RDW 25.6* 25.5*  LYMPHSABS 0.3* PENDING  MONOABS 0.1* PENDING  EOSABS 0.0 PENDING  BASOSABS 0.0 PENDING   ------------------------------------------------------------------------------------------------------------------  Chemistries   Recent Labs Lab 12/24/15 1011 12/26/15 1220  NA 134* 135  K 4.2 3.8  CL 105 104  CO2 24 24  GLUCOSE 166* 120*  BUN 13 11  CREATININE 0.75 0.68  CALCIUM 8.7* 8.8*  AST  --  17  ALT  --  17  ALKPHOS  --  78  BILITOT  --  0.5   ------------------------------------------------------------------------------------------------------------------ estimated creatinine clearance is 59.9 mL/min (by C-G formula based on Cr of 0.68). ------------------------------------------------------------------------------------------------------------------ No results for input(s): TSH, T4TOTAL, T3FREE, THYROIDAB in the last 72 hours.  Invalid input(s): FREET3  Coagulation profile No results for input(s): INR, PROTIME in the last 168 hours. ------------------------------------------------------------------------------------------------------------------- No results for input(s): DDIMER in the last 72 hours. -------------------------------------------------------------------------------------------------------------------  Cardiac Enzymes  Recent Labs Lab 12/26/15 1220  TROPONINI <0.03   ------------------------------------------------------------------------------------------------------------------ Invalid input(s): POCBNP  ---------------------------------------------------------------------------------------------------------------  Urinalysis    Component Value Date/Time   COLORURINE YELLOW* 12/26/2015 1327   APPEARANCEUR CLOUDY* 12/26/2015 1327   LABSPEC 1.016 12/26/2015 1327    PHURINE 5.0 12/26/2015 1327   GLUCOSEU NEGATIVE 12/26/2015 1327   HGBUR 2+* 12/26/2015 [place] 12/26/2015 [place] 12/26/2015 1327   PROTEINUR 30* 12/26/2015 1327   NITRITE POSITIVE* 12/26/2015 1327   LEUKOCYTESUR 3+* 12/26/2015 1327     RADIOLOGY: No results found.  EKG: Orders placed or performed during the hospital encounter of 12/26/15  . ED EKG  . ED EKG    IMPRESSION AND PLAN:  * UTI   IV ceftriaxone and follow urine culture.  * Neutropenia   This is due to combination of myelodysplasia and chemotherapy last week.   I will call oncology consult in the hospital.  * Symptomatic anemia   Hemoglobin less than 7, myelodysplasia.   Transfuse 1 unit PRBC here.   discussed with pt about possible side effects of blood transfusion- including more common like low grade fever to rare and serious like renal and lung involvement and infections.  She understood- and due to necessity of transfusion- agreed to receive the transfusion.  * Thrombocytopenia   Again, as a part of myelodysplasia. No active bleeding, continue monitoring.   All the records are reviewed and case discussed with ED provider. Management plans discussed with the patient, family and they are in agreement.  CODE STATUS: DO NOT RESUSCITATE    Code Status Orders        Start     Ordered   12/26/15 1504  Do not attempt resuscitation (DNR)   Continuous    Question Answer Comment  In the event of cardiac or respiratory ARREST Do not call a "code blue"   In the event of cardiac or respiratory ARREST Do not perform Intubation, CPR, defibrillation or ACLS   In the event of cardiac or respiratory ARREST Use medication by any route, position, wound care, and other measures to relive pain and suffering. May use oxygen, suction and manual treatment of airway obstruction as needed for comfort.   Comments confirmed with pt and her husband and daughter present in room.       12/26/15 1504    Code Status History    Date Active Date Inactive Code Status Order ID Comments User Context   This patient has a current code status but no historical code status.     Patient's husband her daughter and her daughter-in-law present in the room during my visit, and I discussed the plan after getting permission from patient with all of them and they understood and agreed. I'll also ask about patient's wishes for resuscitation and she denied for that and she would like to be DO NOT RESUSCITATE and her family is aware about that.  TOTAL TIME TAKING CARE OF THIS PATIENT: 50 minutes.    Vaughan Basta M.D on 12/26/2015   Between 7am to 6pm - Pager - [phone]  After 6pm go to [email or web address] - password [email or web address] [facility]  Office  [phone]  CC: Primary care physician; Ezequiel Kayser, MD   Note: This dictation was prepared with Dragon dictation along with smaller  Company secretary. Any transcriptional errors that result from this process are unintentional.

## 2015-12-26 NOTE — ED Notes (Signed)
Dr. Reita Cliche notified of WBC 0.8

## 2015-12-26 NOTE — Progress Notes (Signed)
[place]  Telephone:(336) [phone] [phone]) [phone]  ID: Kathryn Boyd OB: [birth year]  MR#: [id number]  LHT#:[id number]  Patient Care Team: Ezequiel Kayser, MD as PCP - General (Internal Medicine)  CHIEF COMPLAINT:  Chief Complaint  Patient presents with  . RAEB-2  . Pancytopenia    INTERVAL HISTORY: Patient returns to clinic today for further evaluation and to assess her toleration of subcutaneous azacitidine. Patient continues to have significant weakness and fatigue. Her main complaint today is of worsening hemorrhoids taking it difficult to sit. She also is having constipation, but after taking MiraLAX she had several loose stools. She also is having difficulty sleeping. She denies any repeated infections. She has no neurologic complaints. She denies any chest pain, shortness of breath, cough, or hemoptysis. She denies any abdominal pain. She has no nausea, vomiting, constipation, or diarrhea. She has no urinary complaints. Patient offers no further specific complaints today.  REVIEW OF SYSTEMS:   Review of Systems  Constitutional: Positive for malaise/fatigue. Negative for fever, chills, weight loss and diaphoresis.  Respiratory: Negative.  Negative for cough and shortness of breath.   Cardiovascular: Negative.  Negative for chest pain.  Gastrointestinal: Positive for constipation. Negative for blood in stool and melena.  Genitourinary: Negative.  Negative for dysuria.  Musculoskeletal: Negative.   Neurological: Positive for weakness.  Psychiatric/Behavioral: Negative.     As per HPI. Otherwise, a complete review of systems is negatve.  PAST MEDICAL HISTORY: Past Medical History  Diagnosis Date  . Cancer ([place])     skin ca  . GERD (gastroesophageal reflux disease)   . Hyperlipidemia   . Osteopenia   . 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 PONV (postoperative nausea and vomiting)   . Arthritis   . Anemia      PAST SURGICAL HISTORY: Past Surgical History  Procedure Laterality Date  . Colonoscopy  1999  . Laparoscopic salpingoopherectomy  1968  . Functional endoscopic sinus surgery  2007  . Abdominal surgery  1968    Exploratory. Turned out tubal pregnancy  . Tubal ligation      FAMILY HISTORY Family History  Problem Relation Age of Onset  . Breast cancer Maternal Aunt 65  . Breast cancer Paternal Aunt 41  . Lung cancer Father   . Breast cancer Maternal Aunt   . Heart disease Mother   . Stroke Mother   . Breast cancer Paternal Aunt   . Breast cancer Paternal Aunt   . Colon polyps Sister   . Heart disease Sister   . Rheum arthritis Sister        ADVANCED DIRECTIVES:    HEALTH MAINTENANCE: Social History  Substance Use Topics  . Smoking status: Former Smoker    Quit date: 11/19/1976  . Smokeless tobacco: Never Used  . Alcohol Use: Yes     Comment: rarely     Colonoscopy:  PAP:  Bone density:  Lipid panel:  Allergies  Allergen Reactions  . Codeine Nausea And Vomiting  . Lubiprostone Other (See Comments)    Abdominal cramps  . Statins Other (See Comments)    Current Outpatient Prescriptions  Medication Sig Dispense Refill  . aspirin EC 81 MG tablet Take 81 mg by mouth daily.     Marland Kitchen docusate sodium (COLACE) 100 MG capsule Take 100 mg by mouth 2 (two) times daily. Reported on 11/27/2015    . fluticasone (FLONASE) 50 MCG/ACT nasal spray Place 1-2 sprays into  the nose daily as needed for allergies or rhinitis.     . hydrocortisone (ANUSOL-HC) 2.5 % rectal cream Place 1 application rectally 2 (two) times daily. 30 g 2  . loratadine (CLARITIN) 10 MG tablet Take 10 mg by mouth daily as needed for allergies.    Marland Kitchen omeprazole (PRILOSEC) 20 MG capsule Take 20 mg by mouth daily.     . ondansetron (ZOFRAN) 8 MG tablet Take 1 tablet (8 mg total) by mouth 2 (two) times daily as needed (Nausea or vomiting). 30 tablet 1  . polyethylene glycol powder (GLYCOLAX/MIRALAX) powder  Take 17 g by mouth daily as needed. Reported on 11/27/2015    . prochlorperazine (COMPAZINE) 10 MG tablet Take 1 tablet (10 mg total) by mouth every 6 (six) hours as needed (Nausea or vomiting). 30 tablet 1   No current facility-administered medications for this visit.    OBJECTIVE: Filed Vitals:   12/24/15 1043  BP: 130/78  Pulse: 114  Temp: 98.1 F (36.7 C)  Resp: 18     Body mass index is 27.54 kg/(m^2).    ECOG FS:1 - Symptomatic but completely ambulatory  General: Ill-appearing, no acute distress. Eyes: Pink conjunctiva, anicteric sclera. Lungs: Clear to auscultation bilaterally. Heart: Regular rate and rhythm. No rubs, murmurs, or gallops. Abdomen: Soft, nontender, nondistended. No organomegaly noted, normoactive bowel sounds. Musculoskeletal: No edema, cyanosis, or clubbing. Neuro: Alert, answering all questions appropriately. Cranial nerves grossly intact. Skin: No rashes or petechiae noted. Psych: Normal affect.   LAB RESULTS:  Lab Results  Component Value Date   NA 135 12/26/2015   K 3.8 12/26/2015   CL 104 12/26/2015   CO2 24 12/26/2015   GLUCOSE 120* 12/26/2015   BUN 11 12/26/2015   CREATININE 0.68 12/26/2015   CALCIUM 8.8* 12/26/2015   PROT 6.9 12/26/2015   ALBUMIN 3.4* 12/26/2015   AST 17 12/26/2015   ALT 17 12/26/2015   ALKPHOS 78 12/26/2015   BILITOT 0.5 12/26/2015   GFRNONAA >60 12/26/2015   GFRAA >60 12/26/2015    Lab Results  Component Value Date   WBC 0.6* 12/24/2015   NEUTROABS 0.1* 12/24/2015   HGB 7.6* 12/24/2015   HCT 22.0* 12/24/2015   MCV 96.2 12/24/2015   PLT 69* 12/24/2015     STUDIES: No results found.  ASSESSMENT: MDS, specifically refractory anemia with excess blasts-2.   PLAN:    1. 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 Patient has approximately 15% blasts in her bone marrow. She was also noted to have multilineage dyspoiesis and atypical megakaryocytes. Patient noted to have multiple cytogenetic  abnormalities placing her at high risk for conversion to acute leukemia. She completed cycle 1 of subcutaneous Vidaza 75m/m2/d on days 1 through 5 and days 8 and 9 on a 28 day cycle approximately one week ago. Return to clinic in 1 week for laboratory work and then in 2 weeks for consideration of cycle 2.  [facility] if patient does convert to acute leukemia. This appointment is scheduled for January 03, 2016 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 Patient will also require irradiated blood products. 3. Anemia: Patient is symptomatic, therefore she will return to clinic on Wednesday for one unit of packed red blood cells. 4. Hemorrhoids: Continue current topical treatment. Also recommended scheduled stool softeners as well as MiraLAX to avoid further constipation.  Approximately 30 minutes was spent in discussion of which greater than 50%  was consultation.  Patient expressed understanding and was in agreement with this plan. She also understands that She can call clinic at any time with any questions, concerns, or complaints.    Lloyd Huger, MD   12/26/2015 1:30 PM

## 2015-12-27 DIAGNOSIS — Z7982 Long term (current) use of aspirin: Secondary | ICD-10-CM

## 2015-12-27 DIAGNOSIS — Z801 Family history of malignant neoplasm of trachea, bronchus and lung: Secondary | ICD-10-CM

## 2015-12-27 DIAGNOSIS — E785 Hyperlipidemia, unspecified: Secondary | ICD-10-CM

## 2015-12-27 DIAGNOSIS — M8580 Other specified disorders of bone density and structure, unspecified site: Secondary | ICD-10-CM

## 2015-12-27 DIAGNOSIS — N390 Urinary tract infection, site not specified: Principal | ICD-10-CM

## 2015-12-27 DIAGNOSIS — R197 Diarrhea, unspecified: Secondary | ICD-10-CM

## 2015-12-27 DIAGNOSIS — D649 Anemia, unspecified: Secondary | ICD-10-CM

## 2015-12-27 DIAGNOSIS — Z79899 Other long term (current) drug therapy: Secondary | ICD-10-CM

## 2015-12-27 DIAGNOSIS — M129 Arthropathy, unspecified: Secondary | ICD-10-CM

## 2015-12-27 DIAGNOSIS — R5383 Other fatigue: Secondary | ICD-10-CM

## 2015-12-27 DIAGNOSIS — Z85828 Personal history of other malignant neoplasm of skin: Secondary | ICD-10-CM

## 2015-12-27 DIAGNOSIS — R531 Weakness: Secondary | ICD-10-CM

## 2015-12-27 DIAGNOSIS — D61818 Other pancytopenia: Secondary | ICD-10-CM

## 2015-12-27 DIAGNOSIS — Z87891 Personal history of nicotine dependence: Secondary | ICD-10-CM

## 2015-12-27 DIAGNOSIS — D469 Myelodysplastic syndrome, unspecified: Secondary | ICD-10-CM

## 2015-12-27 DIAGNOSIS — Z803 Family history of malignant neoplasm of breast: Secondary | ICD-10-CM

## 2015-12-27 DIAGNOSIS — K219 Gastro-esophageal reflux disease without esophagitis: Secondary | ICD-10-CM

## 2015-12-27 LAB — CBC
HCT: 20.9 % — ABNORMAL LOW (ref 35.0–47.0)
Hemoglobin: 7.2 g/dL — ABNORMAL LOW (ref 12.0–16.0)
MCH: 32 pg (ref 26.0–34.0)
MCHC: 34.5 g/dL (ref 32.0–36.0)
MCV: 92.9 fL (ref 80.0–100.0)
Platelets: 46 10*3/uL — ABNORMAL LOW (ref 150–440)
RBC: 2.25 MIL/uL — ABNORMAL LOW (ref 3.80–5.20)
RDW: 22.7 % — AB (ref 11.5–14.5)
WBC: 1.2 10*3/uL — CL (ref 3.6–11.0)

## 2015-12-27 LAB — BASIC METABOLIC PANEL
Anion gap: 5 (ref 5–15)
BUN: 11 mg/dL (ref 6–20)
CALCIUM: 8.4 mg/dL — AB (ref 8.9–10.3)
CO2: 26 mmol/L (ref 22–32)
CREATININE: 0.78 mg/dL (ref 0.44–1.00)
Chloride: 105 mmol/L (ref 101–111)
GLUCOSE: 126 mg/dL — AB (ref 65–99)
Potassium: 4.1 mmol/L (ref 3.5–5.1)
SODIUM: 136 mmol/L (ref 135–145)

## 2015-12-27 LAB — PREPARE RBC (CROSSMATCH)

## 2015-12-27 MED ORDER — HEPARIN SOD (PORK) LOCK FLUSH 100 UNIT/ML IV SOLN: 250.0000 [IU] | INTRAVENOUS | Status: DC | PRN

## 2015-12-27 MED ORDER — ACETAMINOPHEN 325 MG PO TABS
650.0000 mg | ORAL_TABLET | Freq: Once | ORAL | Status: AC
  Administered 2015-12-27: 14:00:00 650 mg via ORAL
  Filled 2015-12-27: qty 2

## 2015-12-27 MED ORDER — SODIUM CHLORIDE 0.9% FLUSH: 3.0000 mL | INTRAVENOUS | Status: DC | PRN

## 2015-12-27 MED ORDER — HEPARIN SOD (PORK) LOCK FLUSH 100 UNIT/ML IV SOLN: 500.0000 [IU] | Freq: Every day | INTRAVENOUS | Status: DC | PRN

## 2015-12-27 MED ORDER — SODIUM CHLORIDE 0.9% FLUSH: 10.0000 mL | INTRAVENOUS | Status: DC | PRN

## 2015-12-27 MED ORDER — SODIUM CHLORIDE 0.9 % IV SOLN
250.0000 mL | Freq: Once | INTRAVENOUS | Status: AC
  Administered 2015-12-27: 14:00:00 250 mL via INTRAVENOUS

## 2015-12-27 MED ORDER — CIPROFLOXACIN HCL 500 MG PO TABS: 500.0000 mg | ORAL_TABLET | Freq: Two times a day (BID) | ORAL | Status: DC

## 2015-12-27 MED ORDER — DIPHENHYDRAMINE HCL 50 MG/ML IJ SOLN
25.0000 mg | Freq: Once | INTRAMUSCULAR | Status: AC
  Administered 2015-12-27: 14:00:00 25 mg via INTRAVENOUS
  Filled 2015-12-27: qty 1

## 2015-12-27 NOTE — Progress Notes (Signed)
Discharge packet reviewed with patient verbalizing understanding. Prescription given to patient. Family to transport patient home.

## 2015-12-27 NOTE — Discharge Instructions (Signed)

## 2015-12-27 NOTE — Consult Note (Signed)
[facility]  [phone] [phone]  ID: Kathryn Boyd OB: 05/06/1941  MR#: [id number]  CSN#:[id number]  Patient Care Team: David Thies, MD as PCP - General (Internal Medicine)  CHIEF COMPLAINT:  Chief Complaint  Patient presents with  . Diarrhea  . Anemia    INTERVAL HISTORY: Patient is a 74-year-old female with MDS who recently completed chemotherapy with Vidaza approximately 2 weeks ago. She is scheduled to receive blood transfusion in clinic yesterday for her anemia, but is significantly weak and fatigued with nausea and diarrhea and subsequently went to the emergency room. Patient was found to have a UTI and admitted for observation. Currently, she feels significantly improved and admission and nearly back to her baseline. She received one unit of packed red blood cells yesterday. She has no neurologic complaints. She denies any fevers. She denies any chest pain or shortness of breath. She has no nausea, vomiting, constipation, or diarrhea. She has no further urinary complaints. Patient offers no further specific complaints.  REVIEW OF SYSTEMS:   Review of Systems  Constitutional: Positive for malaise/fatigue. Negative for fever.  Respiratory: Negative.  Negative for cough and shortness of breath.   Cardiovascular: Negative.  Negative for chest pain.  Gastrointestinal: Negative.  Negative for nausea, vomiting, abdominal pain, blood in stool and melena.  Genitourinary: Negative.  Negative for dysuria, urgency, frequency and hematuria.  Musculoskeletal: Negative.   Neurological: Positive for weakness.  Psychiatric/Behavioral: Negative.     As per HPI. Otherwise, a complete review of systems is negatve.  PAST MEDICAL HISTORY: Past Medical History  Diagnosis Date  . Cancer ([facility])     skin ca  . GERD (gastroesophageal reflux disease)   . Hyperlipidemia   . Osteopenia   . Goiter diffuse 06/11/2014    R>L lobe, with cysts-- stable per  09/02/2013 U/S   . PONV (postoperative nausea and vomiting)   . Arthritis   . Anemia   . Myeloid dysplasia ([facility])     PAST SURGICAL HISTORY: Past Surgical History  Procedure Laterality Date  . Colonoscopy  1999  . Laparoscopic salpingoopherectomy  1968  . Functional endoscopic sinus surgery  2007  . Abdominal surgery  1968    Exploratory. Turned out tubal pregnancy  . Tubal ligation      FAMILY HISTORY Family History  Problem Relation Age of Onset  . Breast cancer Maternal Aunt 65  . Breast cancer Paternal Aunt 65  . Lung cancer Father   . Breast cancer Maternal Aunt   . Heart disease Mother   . Stroke Mother   . Breast cancer Paternal Aunt   . Breast cancer Paternal Aunt   . Colon polyps Sister   . Heart disease Sister   . Rheum arthritis Sister        ADVANCED DIRECTIVES:    HEALTH MAINTENANCE: Social History  Substance Use Topics  . Smoking status: Former Smoker    Quit date: 11/19/1976  . Smokeless tobacco: Never Used  . Alcohol Use: Yes     Comment: rarely     Colonoscopy:  PAP:  Bone density:  Lipid panel:  Allergies  Allergen Reactions  . Codeine Nausea And Vomiting  . Lubiprostone Other (See Comments)    Abdominal cramps  . Statins Other (See Comments)  . Shellfish Allergy     Current Facility-Administered Medications  Medication Dose Route Frequency Provider Last Rate Last Dose  . aspirin EC tablet 81 mg  81 mg Oral Daily Vaibhavkumar Vachhani, MD     81 mg at 12/27/15 1141  . cefTRIAXone (ROCEPHIN) 1 g in dextrose 5 % 50 mL IVPB  1 g Intravenous Q24H Vaibhavkumar Vachhani, MD   1 g at 12/27/15 1341  . docusate sodium (COLACE) capsule 100 mg  100 mg Oral BID Vaibhavkumar Vachhani, MD   100 mg at 12/27/15 1141  . fluticasone (FLONASE) 50 MCG/ACT nasal spray 1 spray  1 spray Each Nare Daily Vaibhavkumar Vachhani, MD      . heparin lock flush 100 unit/mL  500 Units Intracatheter Daily PRN Timothy J Finnegan, MD      . heparin lock flush 100  unit/mL  250 Units Intracatheter PRN Timothy J Finnegan, MD      . hydrocortisone (ANUSOL-HC) 2.5 % rectal cream 1 application  1 application Rectal BID Vaibhavkumar Vachhani, MD   1 application at 12/26/15 2241  . loratadine (CLARITIN) tablet 10 mg  10 mg Oral Daily PRN Vaibhavkumar Vachhani, MD      . ondansetron (ZOFRAN) tablet 8 mg  8 mg Oral BID PRN Vaibhavkumar Vachhani, MD      . pantoprazole (PROTONIX) EC tablet 40 mg  40 mg Oral Daily Vaibhavkumar Vachhani, MD   40 mg at 12/27/15 1141  . polyethylene glycol powder (GLYCOLAX/MIRALAX) container 17 g  17 g Oral Daily PRN Vaibhavkumar Vachhani, MD      . prochlorperazine (COMPAZINE) tablet 10 mg  10 mg Oral Q6H PRN Vaibhavkumar Vachhani, MD      . sodium chloride flush (NS) 0.9 % injection 10 mL  10 mL Intracatheter PRN Timothy J Finnegan, MD      . sodium chloride flush (NS) 0.9 % injection 3 mL  3 mL Intracatheter PRN Timothy J Finnegan, MD       Current Outpatient Prescriptions  Medication Sig Dispense Refill  . aspirin EC 81 MG tablet Take 81 mg by mouth daily.     . docusate sodium (COLACE) 100 MG capsule Take 100 mg by mouth 2 (two) times daily. Reported on 11/27/2015    . fluticasone (FLONASE) 50 MCG/ACT nasal spray Place 1-2 sprays into the nose daily as needed for allergies or rhinitis.     . hydrocortisone (ANUSOL-HC) 2.5 % rectal cream Place 1 application rectally 2 (two) times daily. 30 g 2  . loratadine (CLARITIN) 10 MG tablet Take 10 mg by mouth daily as needed for allergies.    . omeprazole (PRILOSEC) 20 MG capsule Take 20 mg by mouth daily.     . ondansetron (ZOFRAN) 8 MG tablet Take 1 tablet (8 mg total) by mouth 2 (two) times daily as needed (Nausea or vomiting). 30 tablet 1  . polyethylene glycol powder (GLYCOLAX/MIRALAX) powder Take 17 g by mouth daily as needed. Reported on 11/27/2015    . prochlorperazine (COMPAZINE) 10 MG tablet Take 1 tablet (10 mg total) by mouth every 6 (six) hours as needed (Nausea or vomiting). 30  tablet 1  . ciprofloxacin (CIPRO) 500 MG tablet Take 1 tablet (500 mg total) by mouth 2 (two) times daily. 10 tablet 0    OBJECTIVE: Filed Vitals:   12/27/15 1455 12/27/15 1810  BP: 109/49 120/57  Pulse: 104 94  Temp: 98.2 F (36.8 C) 97.9 F (36.6 C)  Resp: 20 18     Body mass index is 27.11 kg/(m^2).    ECOG FS:1 - Symptomatic but completely ambulatory  General: Well-developed, well-nourished, no acute distress. Eyes: Pink conjunctiva, anicteric sclera. HEENT: Normocephalic, moist mucous membranes, clear oropharnyx. Lungs: Clear to auscultation bilaterally.   Heart: Regular rate and rhythm. No rubs, murmurs, or gallops. Abdomen: Soft, nontender, nondistended. No organomegaly noted, normoactive bowel sounds. Musculoskeletal: No edema, cyanosis, or clubbing. Neuro: Alert, answering all questions appropriately. Cranial nerves grossly intact. Skin: No rashes or petechiae noted. Psych: Normal affect. Lymphatics: No cervical, calvicular, axillary or inguinal LAD.   LAB RESULTS:  Lab Results  Component Value Date   NA 136 12/27/2015   K 4.1 12/27/2015   CL 105 12/27/2015   CO2 26 12/27/2015   GLUCOSE 126* 12/27/2015   BUN 11 12/27/2015   CREATININE 0.78 12/27/2015   CALCIUM 8.4* 12/27/2015   PROT 6.9 12/26/2015   ALBUMIN 3.4* 12/26/2015   AST 17 12/26/2015   ALT 17 12/26/2015   ALKPHOS 78 12/26/2015   BILITOT 0.5 12/26/2015   GFRNONAA >60 12/27/2015   GFRAA >60 12/27/2015    Lab Results  Component Value Date   WBC 1.2* 12/27/2015   NEUTROABS 0.1* 12/26/2015   HGB 7.2* 12/27/2015   HCT 20.9* 12/27/2015   MCV 92.9 12/27/2015   PLT 46* 12/27/2015     STUDIES: No results found.  ASSESSMENT: MDS now with UTI.  PLAN:    1. 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 Patient has approximately 15% blasts in her bone marrow. She was also noted to have multilineage dyspoiesis and atypical megakaryocytes. Patient noted to have multiple cytogenetic abnormalities  placing her at high risk for conversion to acute leukemia. She completed cycle 1 of subcutaneous Vidaza 75mg/m2/d on days 1 through 5 and days 8 and 9 on a 28 day cycle approximately 2 weeks ago.  Patient was also previous given a referral to [facility] for further evaluation and monitoring in preparation if patient does convert to acute leukemia. She has been instructed to keep her previously scheduled follow-up appointment next week for laboratory work and in 2 weeks for consideration of cycle 2. 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 Patient will also require irradiated blood products. 3. Anemia: Have ordered one additional unit of blood today. 4. Disposition: Okay to discharge home from an oncology standpoint after patient receives her blood.  Appreciate consult, call with questions.  Timothy J Finnegan, MD   12/27/2015 7:09 PM      

## 2015-12-28 LAB — TYPE AND SCREEN
ABO/RH(D): O POS
ANTIBODY SCREEN: NEGATIVE
UNIT DIVISION: 0
Unit division: 0

## 2015-12-29 LAB — URINE CULTURE: Culture: >=100000 — AB

## 2015-12-31 LAB — CULTURE, BLOOD (ROUTINE X 2)
CULTURE: NO GROWTH
Culture: NO GROWTH

## 2016-01-02 ENCOUNTER — Inpatient Hospital Stay: Payer: Medicare Other

## 2016-01-02 ENCOUNTER — Telehealth: Payer: Self-pay | Admitting: *Deleted

## 2016-01-02 DIAGNOSIS — Z800 Family history of malignant neoplasm of digestive organs: Secondary | ICD-10-CM | POA: Diagnosis not present

## 2016-01-02 DIAGNOSIS — D471 Chronic myeloproliferative disease: Secondary | ICD-10-CM | POA: Diagnosis not present

## 2016-01-02 DIAGNOSIS — Z7982 Long term (current) use of aspirin: Secondary | ICD-10-CM | POA: Diagnosis not present

## 2016-01-02 DIAGNOSIS — Z85828 Personal history of other malignant neoplasm of skin: Secondary | ICD-10-CM | POA: Diagnosis not present

## 2016-01-02 DIAGNOSIS — R5383 Other fatigue: Secondary | ICD-10-CM | POA: Diagnosis not present

## 2016-01-02 DIAGNOSIS — Z803 Family history of malignant neoplasm of breast: Secondary | ICD-10-CM | POA: Diagnosis not present

## 2016-01-02 DIAGNOSIS — M129 Arthropathy, unspecified: Secondary | ICD-10-CM | POA: Diagnosis not present

## 2016-01-02 DIAGNOSIS — D61818 Other pancytopenia: Secondary | ICD-10-CM | POA: Diagnosis not present

## 2016-01-02 DIAGNOSIS — R531 Weakness: Secondary | ICD-10-CM | POA: Diagnosis not present

## 2016-01-02 DIAGNOSIS — E785 Hyperlipidemia, unspecified: Secondary | ICD-10-CM | POA: Diagnosis not present

## 2016-01-02 DIAGNOSIS — G4700 Insomnia, unspecified: Secondary | ICD-10-CM | POA: Diagnosis not present

## 2016-01-02 DIAGNOSIS — Z87891 Personal history of nicotine dependence: Secondary | ICD-10-CM | POA: Diagnosis not present

## 2016-01-02 DIAGNOSIS — K219 Gastro-esophageal reflux disease without esophagitis: Secondary | ICD-10-CM | POA: Diagnosis not present

## 2016-01-02 DIAGNOSIS — E049 Nontoxic goiter, unspecified: Secondary | ICD-10-CM | POA: Diagnosis not present

## 2016-01-02 DIAGNOSIS — M818 Other osteoporosis without current pathological fracture: Secondary | ICD-10-CM | POA: Diagnosis not present

## 2016-01-02 DIAGNOSIS — K649 Unspecified hemorrhoids: Secondary | ICD-10-CM | POA: Diagnosis not present

## 2016-01-02 DIAGNOSIS — D4622 Refractory anemia with excess of blasts 2: Secondary | ICD-10-CM

## 2016-01-02 DIAGNOSIS — Z79899 Other long term (current) drug therapy: Secondary | ICD-10-CM | POA: Diagnosis not present

## 2016-01-02 LAB — CBC WITH DIFFERENTIAL/PLATELET
BASOS ABS: 0 10*3/uL (ref 0–0.1)
Basophils Relative: 3 %
EOS ABS: 0 10*3/uL (ref 0–0.7)
Eosinophils Relative: 1 %
HCT: 25.9 % — ABNORMAL LOW (ref 35.0–47.0)
HEMOGLOBIN: 8.9 g/dL — AB (ref 12.0–16.0)
LYMPHS ABS: 0.8 10*3/uL — AB (ref 1.0–3.6)
Lymphocytes Relative: 74 %
MCH: 32 pg (ref 26.0–34.0)
MCHC: 34.3 g/dL (ref 32.0–36.0)
MCV: 93.3 fL (ref 80.0–100.0)
Monocytes Absolute: 0.2 10*3/uL (ref 0.2–0.9)
Monocytes Relative: 15 %
Neutro Abs: 0.1 10*3/uL — ABNORMAL LOW (ref 1.4–6.5)
Platelets: 46 10*3/uL — ABNORMAL LOW (ref 150–440)
RBC: 2.78 MIL/uL — AB (ref 3.80–5.20)
RDW: 20 % — ABNORMAL HIGH (ref 11.5–14.5)
WBC: 1.1 10*3/uL — AB (ref 3.6–11.0)

## 2016-01-02 LAB — BASIC METABOLIC PANEL
ANION GAP: 6 (ref 5–15)
BUN: 17 mg/dL (ref 6–20)
CHLORIDE: 104 mmol/L (ref 101–111)
CO2: 26 mmol/L (ref 22–32)
Calcium: 8.9 mg/dL (ref 8.9–10.3)
Creatinine, Ser: 0.8 mg/dL (ref 0.44–1.00)
Glucose, Bld: 89 mg/dL (ref 65–99)
POTASSIUM: 4.1 mmol/L (ref 3.5–5.1)
SODIUM: 136 mmol/L (ref 135–145)

## 2016-01-02 LAB — SAMPLE TO BLOOD BANK

## 2016-01-02 NOTE — Discharge Summary (Signed)
[place] [facility] [place] NAME: Kathryn Boyd    MR#:  [id number]  DATE OF BIRTH:  08/12/1940  DATE OF ADMISSION:  12/26/2015 ADMITTING PHYSICIAN: Vaughan Basta, MD  DATE OF DISCHARGE: 12/27/2015  6:55 PM  PRIMARY CARE PHYSICIAN: Ezequiel Kayser, MD   ADMISSION DIAGNOSIS:  UTI (lower urinary tract infection) [N39.0] Cellulitis of index finger, left [L03.012] Symptomatic anemia [D64.9] Neutropenia, unspecified type ([place]) [D70.9]  DISCHARGE DIAGNOSIS:  Principal Problem:   UTI (lower urinary tract infection)   SECONDARY DIAGNOSIS:   Past Medical History  Diagnosis Date  . Cancer ([place])     skin ca  . GERD (gastroesophageal reflux disease)   . Hyperlipidemia   . Osteopenia   . 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 PONV (postoperative nausea and vomiting)   . Arthritis   . Anemia   . Myeloid dysplasia ([place])      ADMITTING HISTORY  HISTORY OF PRESENT ILLNESS: Kathryn Boyd is a 75 y.o. female with a known history of Myelodysplasia, skin cancer, arthritis, hyperlipidemia, chronic anemia- had last chemotherapy last week and on follow-up her hemoglobin was running low today she was supposed to go to [place] to her blood transfusion today. She started feeling very weak and some nausea for last 4-5 days after the level that she could not go to [place] today and decided to come to emergency room. In ER she was noted to have urinary tract infection and her white blood cell count was 800. ER physician spoke to oncologist on call and he suggested to admit the patient with IV antibiotics. He also suggested to transfuse 1 unit of PRBC in the hospital.  HOSPITAL COURSE:   * E coli UTI  IV ceftriaxone in the hospital. Cultures returned pansensitive and patient was changed to oral ciprofloxacin at discharge.  * Neutropenia  This is due to combination of myelodysplasia and  chemotherapy last week.  Improving. Afebrile.  * Symptomatic anemia Patient had 2 units packed RBC transfused as per recommendations from her hematologist Dr. Grayland Ormond. Hemoglobin improved. No bleeding. Patient has myelodysplastic syndrome.  * Thrombocytopenia  Again, as a part of myelodysplasia. No active bleeding, continue monitoring.  Discussed care with Dr. Grayland Ormond prior to discharge who will see the patient as outpatient.  CONSULTS OBTAINED:  Treatment Team:  Lloyd Huger, MD  DRUG ALLERGIES:   Allergies  Allergen Reactions  . Codeine Nausea And Vomiting  . Lubiprostone Other (See Comments)    Abdominal cramps  . Statins Other (See Comments)  . Shellfish Allergy     DISCHARGE MEDICATIONS:   Discharge Medication List as of 12/27/2015  6:12 PM    START taking these medications   Details  ciprofloxacin (CIPRO) 500 MG tablet Take 1 tablet (500 mg total) by mouth 2 (two) times daily., Starting 12/27/2015, Until Discontinued, Print      CONTINUE these medications which have NOT CHANGED   Details  aspirin EC 81 MG tablet Take 81 mg by mouth daily. , Until Discontinued, Historical Med    docusate sodium (COLACE) 100 MG capsule Take 100 mg by mouth 2 (two) times daily. Reported on 11/27/2015, Until Discontinued, Historical Med    fluticasone (FLONASE) 50 MCG/ACT nasal spray Place 1-2 sprays into the nose daily as needed for allergies or rhinitis. , Starting 06/14/2015, Until Discontinued, Historical Med    hydrocortisone (ANUSOL-HC) 2.5 % rectal cream Place 1  application rectally 2 (two) times daily., Starting 12/20/2015, Until Discontinued, Normal    loratadine (CLARITIN) 10 MG tablet Take 10 mg by mouth daily as needed for allergies., Until Discontinued, Historical Med    omeprazole (PRILOSEC) 20 MG capsule Take 20 mg by mouth daily. , Starting 11/08/2015, Until Discontinued, Historical Med    ondansetron (ZOFRAN) 8 MG tablet Take 1 tablet (8 mg total) by mouth 2  (two) times daily as needed (Nausea or vomiting)., Starting 12/02/2015, Until Discontinued, Normal    polyethylene glycol powder (GLYCOLAX/MIRALAX) powder Take 17 g by mouth daily as needed. Reported on 11/27/2015, Until Discontinued, Historical Med    prochlorperazine (COMPAZINE) 10 MG tablet Take 1 tablet (10 mg total) by mouth every 6 (six) hours as needed (Nausea or vomiting)., Starting 12/02/2015, Until Discontinued, Normal        Today   VITAL SIGNS:  Blood pressure 120/57, pulse 94, temperature 97.9 F (36.6 C), temperature source Oral, resp. rate 18, height 5\' 4"  (1.626 m), weight 71.668 kg (158 lb), SpO2 99 %.  I/O:  No intake or output data in the 24 hours ending 01/02/16 1635  PHYSICAL EXAMINATION:  Physical Exam  GENERAL:  75 y.o.-year-old patient lying in the bed with no acute distress.  LUNGS: Normal breath sounds bilaterally, no wheezing, rales,rhonchi or crepitation. No use of accessory muscles of respiration.  CARDIOVASCULAR: S1, S2 normal. No murmurs, rubs, or gallops.  ABDOMEN: Soft, non-tender, non-distended. Bowel sounds present. No organomegaly or mass.  NEUROLOGIC: Moves all 4 extremities. PSYCHIATRIC: The patient is alert and oriented x 3.  SKIN: No obvious rash, lesion, or ulcer.   DATA REVIEW:   CBC  Recent Labs Lab 01/02/16 1137  WBC 1.1*  HGB 8.9*  HCT 25.9*  PLT 46*    Chemistries   Recent Labs Lab 01/02/16 1137  NA 136  K 4.1  CL 104  CO2 26  GLUCOSE 89  BUN 17  CREATININE 0.80  CALCIUM 8.9    Cardiac Enzymes No results for input(s): TROPONINI in the last 168 hours.  Microbiology Results  Results for orders placed or performed during the hospital encounter of 12/26/15  Culture, blood (routine x 2)     Status: None   Collection Time: 12/26/15 11:32 AM  Result Value Ref Range Status   Specimen Description BLOOD R AC  Final   Special Requests BOTTLES DRAWN AEROBIC AND ANAEROBIC 6CC  Final   Culture NO GROWTH 5 DAYS  Final    Report Status 12/31/2015 FINAL  Final  Urine culture     Status: Abnormal   Collection Time: 12/26/15  1:27 PM  Result Value Ref Range Status   Specimen Description URINE, RANDOM  Final   Special Requests NONE  Final   Culture >=100,000 COLONIES/mL ESCHERICHIA COLI (A)  Final   Report Status 12/29/2015 FINAL  Final   Organism ID, Bacteria ESCHERICHIA COLI (A)  Final      Susceptibility   Escherichia coli - MIC*    AMPICILLIN <=2 SENSITIVE Sensitive     CEFAZOLIN <=4 SENSITIVE Sensitive     CEFTRIAXONE <=1 SENSITIVE Sensitive     CIPROFLOXACIN <=0.25 SENSITIVE Sensitive     GENTAMICIN <=1 SENSITIVE Sensitive     IMIPENEM <=0.25 SENSITIVE Sensitive     NITROFURANTOIN <=16 SENSITIVE Sensitive     TRIMETH/SULFA <=20 SENSITIVE Sensitive     AMPICILLIN/SULBACTAM <=2 SENSITIVE Sensitive     PIP/TAZO <=4 SENSITIVE Sensitive     Extended ESBL NEGATIVE Sensitive     * >=  100,000 COLONIES/mL ESCHERICHIA COLI  Culture, blood (routine x 2)     Status: None   Collection Time: 12/26/15  2:23 PM  Result Value Ref Range Status   Specimen Description BLOOD LEFT ASSIST CONTROL  Final   Special Requests BOTTLES DRAWN AEROBIC AND ANAEROBIC  [place]  Final   Culture NO GROWTH 5 DAYS  Final   Report Status 12/31/2015 FINAL  Final    RADIOLOGY:  No results found.  Follow up with PCP in 1 week.  Management plans discussed with the patient, family and they are in agreement.  CODE STATUS:  Code Status History    Date Active Date Inactive Code Status Order ID Comments User Context   12/26/2015  3:57 PM 12/27/2015  9:56 PM Full Code [id number]  Vaughan Basta, MD Inpatient   12/26/2015  3:04 PM 12/26/2015  3:57 PM DNR [id number]  Vaughan Basta, MD ED      TOTAL TIME TAKING CARE OF THIS PATIENT ON DAY OF DISCHARGE: more than 30 minutes.   Hillary Bow R M.D on 01/02/2016 at 4:35 PM  Between 7am to 6pm - Pager - [phone]  After 6pm go to [email or web address] - password [email or web address] [place] [facility]  Office  [phone]  CC: Primary care physician; Ezequiel Kayser, MD  Note: This dictation was prepared with Dragon dictation along with smaller phrase technology. Any transcriptional errors that result from this process are unintentional.

## 2016-01-02 NOTE — Telephone Encounter (Signed)
Critical ANC 0.1.

## 2016-01-07 ENCOUNTER — Inpatient Hospital Stay (HOSPITAL_BASED_OUTPATIENT_CLINIC_OR_DEPARTMENT_OTHER): Payer: Medicare Other | Admitting: Oncology

## 2016-01-07 ENCOUNTER — Inpatient Hospital Stay: Payer: Medicare Other | Attending: Oncology

## 2016-01-07 ENCOUNTER — Inpatient Hospital Stay: Payer: Medicare Other

## 2016-01-07 VITALS — BP 120/79 | HR 114 | Temp 98.5°F | Resp 18 | Wt 160.3 lb

## 2016-01-07 DIAGNOSIS — Z801 Family history of malignant neoplasm of trachea, bronchus and lung: Secondary | ICD-10-CM | POA: Diagnosis not present

## 2016-01-07 DIAGNOSIS — R490 Dysphonia: Secondary | ICD-10-CM | POA: Diagnosis not present

## 2016-01-07 DIAGNOSIS — M129 Arthropathy, unspecified: Secondary | ICD-10-CM

## 2016-01-07 DIAGNOSIS — E049 Nontoxic goiter, unspecified: Secondary | ICD-10-CM | POA: Insufficient documentation

## 2016-01-07 DIAGNOSIS — R531 Weakness: Secondary | ICD-10-CM | POA: Insufficient documentation

## 2016-01-07 DIAGNOSIS — R5383 Other fatigue: Secondary | ICD-10-CM

## 2016-01-07 DIAGNOSIS — Z803 Family history of malignant neoplasm of breast: Secondary | ICD-10-CM

## 2016-01-07 DIAGNOSIS — Z5111 Encounter for antineoplastic chemotherapy: Secondary | ICD-10-CM | POA: Diagnosis not present

## 2016-01-07 DIAGNOSIS — K219 Gastro-esophageal reflux disease without esophagitis: Secondary | ICD-10-CM | POA: Insufficient documentation

## 2016-01-07 DIAGNOSIS — K649 Unspecified hemorrhoids: Secondary | ICD-10-CM | POA: Insufficient documentation

## 2016-01-07 DIAGNOSIS — Z79899 Other long term (current) drug therapy: Secondary | ICD-10-CM | POA: Diagnosis not present

## 2016-01-07 DIAGNOSIS — D4622 Refractory anemia with excess of blasts 2: Secondary | ICD-10-CM

## 2016-01-07 DIAGNOSIS — N390 Urinary tract infection, site not specified: Secondary | ICD-10-CM | POA: Diagnosis not present

## 2016-01-07 DIAGNOSIS — E785 Hyperlipidemia, unspecified: Secondary | ICD-10-CM | POA: Insufficient documentation

## 2016-01-07 DIAGNOSIS — D61818 Other pancytopenia: Secondary | ICD-10-CM

## 2016-01-07 DIAGNOSIS — M8580 Other specified disorders of bone density and structure, unspecified site: Secondary | ICD-10-CM

## 2016-01-07 DIAGNOSIS — Z85828 Personal history of other malignant neoplasm of skin: Secondary | ICD-10-CM | POA: Insufficient documentation

## 2016-01-07 DIAGNOSIS — Z87891 Personal history of nicotine dependence: Secondary | ICD-10-CM | POA: Diagnosis not present

## 2016-01-07 LAB — CBC WITH DIFFERENTIAL/PLATELET
BASOS ABS: 0 10*3/uL (ref 0–0.1)
Eosinophils Absolute: 0 10*3/uL (ref 0–0.7)
Eosinophils Relative: 3 %
HEMATOCRIT: 24.9 % — AB (ref 35.0–47.0)
HEMOGLOBIN: 8.7 g/dL — AB (ref 12.0–16.0)
Lymphs Abs: 0.4 10*3/uL — ABNORMAL LOW (ref 1.0–3.6)
MCH: 32.6 pg (ref 26.0–34.0)
MCHC: 34.8 g/dL (ref 32.0–36.0)
MCV: 93.4 fL (ref 80.0–100.0)
MONO ABS: 0.2 10*3/uL (ref 0.2–0.9)
NEUTROS ABS: 0.1 10*3/uL — AB (ref 1.4–6.5)
Platelets: 66 10*3/uL — ABNORMAL LOW (ref 150–440)
RBC: 2.66 MIL/uL — ABNORMAL LOW (ref 3.80–5.20)
RDW: 19.8 % — AB (ref 11.5–14.5)
WBC: 0.7 10*3/uL — CL (ref 3.6–11.0)

## 2016-01-07 LAB — BASIC METABOLIC PANEL
ANION GAP: 6 (ref 5–15)
BUN: 18 mg/dL (ref 6–20)
CHLORIDE: 103 mmol/L (ref 101–111)
CO2: 25 mmol/L (ref 22–32)
Calcium: 8.7 mg/dL — ABNORMAL LOW (ref 8.9–10.3)
Creatinine, Ser: 0.83 mg/dL (ref 0.44–1.00)
GFR calc non Af Amer: >60 mL/min (ref >60)
GLUCOSE: 145 mg/dL — AB (ref 65–99)
Potassium: 3.8 mmol/L (ref 3.5–5.1)
Sodium: 134 mmol/L — ABNORMAL LOW (ref 135–145)

## 2016-01-07 LAB — SAMPLE TO BLOOD BANK

## 2016-01-07 MED ORDER — ONDANSETRON HCL 4 MG PO TABS
8.0000 mg | ORAL_TABLET | Freq: Once | ORAL | Status: AC
  Administered 2016-01-07: 8 mg via ORAL
  Filled 2016-01-07: qty 2

## 2016-01-07 MED ORDER — AZACITIDINE CHEMO SQ INJECTION
75.0000 mg/m2 | Freq: Once | INTRAMUSCULAR | Status: AC
  Administered 2016-01-07: 140 mg via SUBCUTANEOUS
  Filled 2016-01-07: qty 5.6

## 2016-01-07 NOTE — Progress Notes (Signed)
Patient is having an increase in her fatigue today.  She was treated for UTI during recent hospital stay and finished her abx 1 week ago is requesting if Dr. Grayland Ormond can re-check her urine.  Did go for evaluation at [facility] and the visit information is available in Care Everywhere portion of her chart.

## 2016-01-07 NOTE — Progress Notes (Signed)
ANC 0.1, platelets 66000 today. Spoke with Tillie Rung and okay to proceed with treatment per Dr Grayland Ormond

## 2016-01-08 ENCOUNTER — Inpatient Hospital Stay: Payer: Medicare Other

## 2016-01-08 VITALS — BP 108/73 | HR 103 | Temp 97.0°F | Resp 18

## 2016-01-08 DIAGNOSIS — D4622 Refractory anemia with excess of blasts 2: Secondary | ICD-10-CM | POA: Diagnosis not present

## 2016-01-08 MED ORDER — ONDANSETRON HCL 4 MG PO TABS
8.0000 mg | ORAL_TABLET | Freq: Once | ORAL | Status: AC
  Administered 2016-01-08: 8 mg via ORAL
  Filled 2016-01-08: qty 2

## 2016-01-08 MED ORDER — AZACITIDINE CHEMO SQ INJECTION
75.0000 mg/m2 | Freq: Once | INTRAMUSCULAR | Status: AC
  Administered 2016-01-08: 140 mg via SUBCUTANEOUS
  Filled 2016-01-08: qty 5.6

## 2016-01-09 ENCOUNTER — Inpatient Hospital Stay: Payer: Medicare Other

## 2016-01-09 ENCOUNTER — Telehealth: Payer: Self-pay | Admitting: *Deleted

## 2016-01-09 VITALS — BP 110/74 | HR 103 | Temp 97.8°F | Resp 18

## 2016-01-09 DIAGNOSIS — D4622 Refractory anemia with excess of blasts 2: Secondary | ICD-10-CM

## 2016-01-09 MED ORDER — AZACITIDINE CHEMO SQ INJECTION
75.0000 mg/m2 | Freq: Once | INTRAMUSCULAR | Status: AC
  Administered 2016-01-09: 140 mg via SUBCUTANEOUS
  Filled 2016-01-09: qty 5.6

## 2016-01-09 MED ORDER — ONDANSETRON HCL 4 MG PO TABS
8.0000 mg | ORAL_TABLET | Freq: Once | ORAL | Status: AC
  Administered 2016-01-09: 8 mg via ORAL
  Filled 2016-01-09: qty 2

## 2016-01-09 NOTE — Telephone Encounter (Signed)
Asking if she can get her lab checked tomorrow to see if she needs a transfusion before the weekend comes. She has rapid HR and sob dizziness on rising. States she started back on her injections and this causes her hgb to drop. She does have an appt today for inj. Can we check labs today? He was also inquiring aboiut having her urine checked. There is an order in from Monday for UA and C/S

## 2016-01-09 NOTE — Telephone Encounter (Signed)
Per Dr Grayland Ormond, labs tomorrow Pt informed and lab appt added for 130 and she agrees to it

## 2016-01-10 ENCOUNTER — Inpatient Hospital Stay: Payer: Medicare Other

## 2016-01-10 VITALS — BP 122/81 | HR 98 | Temp 97.6°F

## 2016-01-10 DIAGNOSIS — D6489 Other specified anemias: Secondary | ICD-10-CM

## 2016-01-10 DIAGNOSIS — N390 Urinary tract infection, site not specified: Secondary | ICD-10-CM

## 2016-01-10 DIAGNOSIS — D4622 Refractory anemia with excess of blasts 2: Secondary | ICD-10-CM | POA: Diagnosis not present

## 2016-01-10 LAB — CBC WITH DIFFERENTIAL/PLATELET
BASOS ABS: 0 10*3/uL (ref 0–0.1)
EOS ABS: 0 10*3/uL (ref 0–0.7)
HCT: 22.2 % — ABNORMAL LOW (ref 35.0–47.0)
Hemoglobin: 7.6 g/dL — ABNORMAL LOW (ref 12.0–16.0)
Lymphocytes Relative: 88 %
Lymphs Abs: 0.5 10*3/uL — ABNORMAL LOW (ref 1.0–3.6)
MCH: 32 pg (ref 26.0–34.0)
MCHC: 34.3 g/dL (ref 32.0–36.0)
MCV: 93.1 fL (ref 80.0–100.0)
MONO ABS: 0 10*3/uL — AB (ref 0.2–0.9)
Neutro Abs: 0 10*3/uL — ABNORMAL LOW (ref 1.7–7.7)
Neutrophils Relative %: 7 %
PLATELETS: 55 10*3/uL — AB (ref 150–440)
RBC: 2.38 MIL/uL — ABNORMAL LOW (ref 3.80–5.20)
RDW: 21 % — AB (ref 11.5–14.5)
WBC: 0.6 10*3/uL — CL (ref 3.6–11.0)

## 2016-01-10 LAB — URINALYSIS COMPLETE WITH MICROSCOPIC (ARMC ONLY)
BILIRUBIN URINE: NEGATIVE
Bacteria, UA: NONE SEEN
GLUCOSE, UA: NEGATIVE mg/dL
Ketones, ur: NEGATIVE mg/dL
Leukocytes, UA: NEGATIVE
Nitrite: NEGATIVE
PH: 5 (ref 5.0–8.0)
Protein, ur: NEGATIVE mg/dL
Specific Gravity, Urine: 1.016 (ref 1.005–1.030)

## 2016-01-10 LAB — PREPARE RBC (CROSSMATCH)

## 2016-01-10 LAB — SAMPLE TO BLOOD BANK

## 2016-01-10 MED ORDER — ONDANSETRON HCL 4 MG PO TABS
8.0000 mg | ORAL_TABLET | Freq: Once | ORAL | Status: AC
  Administered 2016-01-10: 8 mg via ORAL
  Filled 2016-01-10: qty 2

## 2016-01-10 MED ORDER — AZACITIDINE CHEMO SQ INJECTION
75.0000 mg/m2 | Freq: Once | INTRAMUSCULAR | Status: AC
  Administered 2016-01-10: 140 mg via SUBCUTANEOUS
  Filled 2016-01-10: qty 5.6

## 2016-01-10 NOTE — Addendum Note (Signed)
Addended by: Lorrine Kin A on: 01/10/2016 04:59 PM   Modules accepted: Orders

## 2016-01-11 ENCOUNTER — Inpatient Hospital Stay: Payer: Medicare Other

## 2016-01-11 ENCOUNTER — Other Ambulatory Visit: Payer: Self-pay | Admitting: *Deleted

## 2016-01-11 VITALS — BP 125/81 | HR 88 | Temp 96.8°F | Resp 18

## 2016-01-11 DIAGNOSIS — D469 Myelodysplastic syndrome, unspecified: Secondary | ICD-10-CM

## 2016-01-11 DIAGNOSIS — D4622 Refractory anemia with excess of blasts 2: Secondary | ICD-10-CM | POA: Diagnosis not present

## 2016-01-11 LAB — URINE CULTURE

## 2016-01-11 MED ORDER — DIPHENHYDRAMINE HCL 50 MG/ML IJ SOLN
25.0000 mg | Freq: Once | INTRAMUSCULAR | Status: AC
  Administered 2016-01-11: 25 mg via INTRAVENOUS
  Filled 2016-01-11: qty 1

## 2016-01-11 MED ORDER — ACETAMINOPHEN 325 MG PO TABS
650.0000 mg | ORAL_TABLET | Freq: Once | ORAL | Status: AC
  Administered 2016-01-11: 650 mg via ORAL
  Filled 2016-01-11: qty 2

## 2016-01-11 MED ORDER — SODIUM CHLORIDE 0.9 % IV SOLN
250.0000 mL | Freq: Once | INTRAVENOUS | Status: AC
  Administered 2016-01-11: 250 mL via INTRAVENOUS
  Filled 2016-01-11: qty 250

## 2016-01-11 MED ORDER — ONDANSETRON HCL 4 MG PO TABS
8.0000 mg | ORAL_TABLET | Freq: Once | ORAL | Status: AC
  Administered 2016-01-11: 8 mg via ORAL
  Filled 2016-01-11: qty 2

## 2016-01-11 MED ORDER — AZACITIDINE CHEMO SQ INJECTION
75.0000 mg/m2 | Freq: Once | INTRAMUSCULAR | Status: AC
  Administered 2016-01-11: 140 mg via SUBCUTANEOUS
  Filled 2016-01-11: qty 5.6

## 2016-01-11 MED ORDER — LIDOCAINE-PRILOCAINE 2.5-2.5 % EX CREA: 1.0000 "application " | TOPICAL_CREAM | CUTANEOUS | Status: AC | PRN

## 2016-01-12 LAB — TYPE AND SCREEN
ABO/RH(D): O POS
ANTIBODY SCREEN: NEGATIVE
UNIT DIVISION: 0

## 2016-01-12 NOTE — Progress Notes (Signed)
[facility]  [phone] Fax:(336) [phone]  ID: ADASYN MCADAMS OB: 1940-08-26  MR#: [id number]  ZSW#:[id number]  Patient Care Team: Ezequiel Kayser, MD as PCP - General (Internal Medicine)  CHIEF COMPLAINT:  Chief Complaint  Patient presents with  . MDS    INTERVAL HISTORY: Patient returns to clinic today for further evaluation and Consideration of cycle 2 of Vidaza. She continues to have significant increased weakness and fatigue. She was recently admitted to the hospital for UTI and was placed on antibiotics. She is still having mild urinary frequency and has requested a repeat check of her urine. She has no neurologic complaints. She denies any chest pain, shortness of breath, cough, or hemoptysis. She denies any abdominal pain. She has no nausea, vomiting, constipation, or diarrhea. Patient offers no further specific complaints today.  REVIEW OF SYSTEMS:   Review of Systems  Constitutional: Positive for malaise/fatigue. Negative for fever, chills, weight loss and diaphoresis.  Respiratory: Negative.  Negative for cough and shortness of breath.   Cardiovascular: Negative.  Negative for chest pain.  Gastrointestinal: Positive for constipation. Negative for blood in stool and melena.  Genitourinary: Positive for frequency.  Musculoskeletal: Negative.   Neurological: Negative.   Psychiatric/Behavioral: Negative.     As per HPI. Otherwise, a complete review of systems is negatve.  PAST MEDICAL HISTORY: Past Medical History  Diagnosis Date  . Cancer ([place])     skin ca  . GERD (gastroesophageal reflux disease)   . Hyperlipidemia   . Osteopenia   . 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 PONV (postoperative nausea and vomiting)   . Arthritis   . Anemia   . 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 Colonoscopy  1999  . Laparoscopic  salpingoopherectomy  1968  . Functional endoscopic sinus surgery  2007  . Abdominal surgery  1968    Exploratory. Turned out tubal pregnancy  . Tubal ligation      FAMILY HISTORY Family History  Problem Relation Age of Onset  . Breast cancer Maternal Aunt 65  . Breast cancer Paternal Aunt 10  . Lung cancer Father   . Breast cancer Maternal Aunt   . Heart disease Mother   . Stroke Mother   . Breast cancer Paternal Aunt   . Breast cancer Paternal Aunt   . Colon polyps Sister   . Heart disease Sister   . Rheum arthritis Sister        ADVANCED DIRECTIVES:    HEALTH MAINTENANCE: Social History  Substance Use Topics  . Smoking status: Former Smoker    Quit date: 11/19/1976  . Smokeless tobacco: Never Used  . Alcohol Use: Yes     Comment: rarely     Colonoscopy:  PAP:  Bone density:  Lipid panel:  Allergies  Allergen Reactions  . Codeine Nausea And Vomiting  . Lubiprostone Other (See Comments)    Abdominal cramps  . Statins Other (See Comments)  . Shellfish Allergy     Current Outpatient Prescriptions  Medication Sig Dispense Refill  . aspirin EC 81 MG tablet Take 81 mg by mouth daily.     . ciprofloxacin (CIPRO) 500 MG tablet Take 1 tablet (500 mg total) by mouth 2 (two) times daily. 10 tablet 0  . docusate sodium (COLACE) 100 MG capsule Take 100 mg by mouth 2 (two) times daily. Reported on 11/27/2015    .  fluconazole (DIFLUCAN) 200 MG tablet     . fluticasone (FLONASE) 50 MCG/ACT nasal spray Place 1-2 sprays into the nose daily as needed for allergies or rhinitis.     . hydrocortisone (ANUSOL-HC) 2.5 % rectal cream Place 1 application rectally 2 (two) times daily. 30 g 2  . levofloxacin (LEVAQUIN) 500 MG tablet Take 500 mg by mouth.    . loratadine (CLARITIN) 10 MG tablet Take 10 mg by mouth daily as needed for allergies.    Marland Kitchen omeprazole (PRILOSEC) 20 MG capsule Take 20 mg by mouth daily.     . ondansetron (ZOFRAN) 8 MG tablet Take 1 tablet (8 mg total) by  mouth 2 (two) times daily as needed (Nausea or vomiting). 30 tablet 1  . polyethylene glycol powder (GLYCOLAX/MIRALAX) powder Take 17 g by mouth daily as needed. Reported on 11/27/2015    . prochlorperazine (COMPAZINE) 10 MG tablet Take 1 tablet (10 mg total) by mouth every 6 (six) hours as needed (Nausea or vomiting). 30 tablet 1  . valACYclovir (VALTREX) 500 MG tablet Take 500 mg by mouth.    . lidocaine-prilocaine (EMLA) cream Apply 1 application topically as needed. Apply to abdomen 1-2 hours prior to injections. 30 g 5   No current facility-administered medications for this visit.    OBJECTIVE: Filed Vitals:   01/07/16 1351  BP: 120/79  Pulse: 114  Temp: 98.5 F (36.9 C)  Resp: 18     Body mass index is 27.5 kg/(m^2).    ECOG FS:1 - Symptomatic but completely ambulatory  General: Ill-appearing, no acute distress. Eyes: Pink conjunctiva, anicteric sclera. Lungs: Clear to auscultation bilaterally. Heart: Regular rate and rhythm. No rubs, murmurs, or gallops. Abdomen: Soft, nontender, nondistended. No organomegaly noted, normoactive bowel sounds. Musculoskeletal: No edema, cyanosis, or clubbing. Neuro: Alert, answering all questions appropriately. Cranial nerves grossly intact. Skin: No rashes or petechiae noted. Psych: Normal affect.   LAB RESULTS:  Lab Results  Component Value Date   NA 134* 01/07/2016   K 3.8 01/07/2016   CL 103 01/07/2016   CO2 25 01/07/2016   GLUCOSE 145* 01/07/2016   BUN 18 01/07/2016   CREATININE 0.83 01/07/2016   CALCIUM 8.7* 01/07/2016   PROT 6.9 12/26/2015   ALBUMIN 3.4* 12/26/2015   AST 17 12/26/2015   ALT 17 12/26/2015   ALKPHOS 78 12/26/2015   BILITOT 0.5 12/26/2015   GFRNONAA >60 01/07/2016   GFRAA >60 01/07/2016    Lab Results  Component Value Date   WBC 0.6* 01/10/2016   NEUTROABS 0.0* 01/10/2016   HGB 7.6* 01/10/2016   HCT 22.2* 01/10/2016   MCV 93.1 01/10/2016   PLT 55* 01/10/2016     STUDIES: No results  found.  ASSESSMENT: MDS, specifically refractory anemia with excess blasts-2.   PLAN:    1. RAEB-2: Patient recently evaluated by [facility]. Her diagnosis was confirmed by bone marrow biopsy results. Patient has approximately 15% blasts in her bone marrow. She was also noted to have multilineage dyspoiesis and atypical megakaryocytes. Patient noted to have multiple cytogenetic abnormalities placing her at high risk for conversion to acute leukemia. Proceed with cycle 2 of subcutaneous Vidaza 39m/m2/d on days 1 through 5 and days 8 and 9 on a 28 day. Will repeat bone marrow biopsy after cycle 4.  Return to clinic in weekly for laboratory work, and 2 weeks for further evaluation, and then in 4 weeks for consideration of cycle 3. Patient is also taking Levaquin, fluconazole, and Valtrex prophylactically. 2. 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 Patient will also require irradiated blood products. 3. Anemia: Patient is symptomatic, therefore she will return to clinic later this week for one unit of packed red blood cells. 4. Hemorrhoids: Continue current topical treatment. Also recommended scheduled stool softeners as well as MiraLAX to avoid further constipation.  Approximately 30 minutes was spent in discussion of which greater than 50% was consultation.  Patient expressed understanding and was in agreement with this plan. She also understands that She can call clinic at any time with any questions, concerns, or complaints.    Lloyd Huger, MD   01/12/2016 9:40 AM

## 2016-01-14 ENCOUNTER — Telehealth: Payer: Self-pay | Admitting: *Deleted

## 2016-01-14 ENCOUNTER — Inpatient Hospital Stay: Payer: Medicare Other

## 2016-01-14 VITALS — BP 126/83 | HR 100 | Resp 20

## 2016-01-14 DIAGNOSIS — D4622 Refractory anemia with excess of blasts 2: Secondary | ICD-10-CM

## 2016-01-14 LAB — CBC WITH DIFFERENTIAL/PLATELET
Basophils Absolute: 0 10*3/uL (ref 0–0.1)
Basophils Relative: 0 %
Eosinophils Absolute: 0 10*3/uL (ref 0–0.7)
Eosinophils Relative: 1 %
HEMATOCRIT: 26.9 % — AB (ref 35.0–47.0)
Hemoglobin: 9.4 g/dL — ABNORMAL LOW (ref 12.0–16.0)
Lymphs Abs: 0.5 10*3/uL — ABNORMAL LOW (ref 1.0–3.6)
MCH: 31.6 pg (ref 26.0–34.0)
MCHC: 34.9 g/dL (ref 32.0–36.0)
MCV: 90.6 fL (ref 80.0–100.0)
MONO ABS: 0.1 10*3/uL — AB (ref 0.2–0.9)
NEUTROS ABS: 0.1 10*3/uL — AB (ref 1.4–6.5)
Neutrophils Relative %: 10 %
Platelets: 43 10*3/uL — ABNORMAL LOW (ref 150–440)
RBC: 2.97 MIL/uL — ABNORMAL LOW (ref 3.80–5.20)
RDW: 18.2 % — AB (ref 11.5–14.5)
WBC: 0.6 10*3/uL — CL (ref 3.6–11.0)

## 2016-01-14 LAB — BASIC METABOLIC PANEL
Anion gap: 5 (ref 5–15)
BUN: 15 mg/dL (ref 6–20)
CALCIUM: 8.9 mg/dL (ref 8.9–10.3)
CO2: 25 mmol/L (ref 22–32)
CREATININE: 0.92 mg/dL (ref 0.44–1.00)
Chloride: 103 mmol/L (ref 101–111)
GFR calc Af Amer: >60 mL/min (ref >60)
GFR calc non Af Amer: 60 mL/min — ABNORMAL LOW (ref >60)
Glucose, Bld: 149 mg/dL — ABNORMAL HIGH (ref 65–99)
Potassium: 3.6 mmol/L (ref 3.5–5.1)
Sodium: 133 mmol/L — ABNORMAL LOW (ref 135–145)

## 2016-01-14 LAB — SAMPLE TO BLOOD BANK

## 2016-01-14 MED ORDER — AZACITIDINE CHEMO SQ INJECTION
75.0000 mg/m2 | Freq: Once | INTRAMUSCULAR | Status: AC
  Administered 2016-01-14: 140 mg via SUBCUTANEOUS
  Filled 2016-01-14: qty 5.6

## 2016-01-14 MED ORDER — ONDANSETRON HCL 4 MG PO TABS
8.0000 mg | ORAL_TABLET | Freq: Once | ORAL | Status: AC
  Administered 2016-01-14: 8 mg via ORAL
  Filled 2016-01-14: qty 2

## 2016-01-14 NOTE — Telephone Encounter (Signed)
Critical ANC 0.1, PLT 43. Dr. Rogue Bussing aware.

## 2016-01-15 ENCOUNTER — Inpatient Hospital Stay: Payer: Medicare Other

## 2016-01-15 VITALS — BP 129/82 | HR 106 | Resp 20

## 2016-01-15 DIAGNOSIS — D4622 Refractory anemia with excess of blasts 2: Secondary | ICD-10-CM | POA: Diagnosis not present

## 2016-01-15 MED ORDER — ONDANSETRON HCL 4 MG PO TABS
8.0000 mg | ORAL_TABLET | Freq: Once | ORAL | Status: AC
  Administered 2016-01-15: 8 mg via ORAL
  Filled 2016-01-15: qty 2

## 2016-01-15 MED ORDER — AZACITIDINE CHEMO SQ INJECTION
75.0000 mg/m2 | Freq: Once | INTRAMUSCULAR | Status: AC
  Administered 2016-01-15: 140 mg via SUBCUTANEOUS
  Filled 2016-01-15: qty 5.6

## 2016-01-18 ENCOUNTER — Inpatient Hospital Stay: Payer: Medicare Other

## 2016-01-18 DIAGNOSIS — D4622 Refractory anemia with excess of blasts 2: Secondary | ICD-10-CM

## 2016-01-18 LAB — CBC WITH DIFFERENTIAL/PLATELET
BASOS PCT: 0 %
Basophils Absolute: 0 10*3/uL (ref 0–0.1)
EOS ABS: 0 10*3/uL (ref 0–0.7)
EOS PCT: 1 %
HCT: 24.8 % — ABNORMAL LOW (ref 35.0–47.0)
HEMOGLOBIN: 8.4 g/dL — AB (ref 12.0–16.0)
Lymphocytes Relative: 84 %
Lymphs Abs: 0.4 10*3/uL — ABNORMAL LOW (ref 1.0–3.6)
MCH: 31.1 pg (ref 26.0–34.0)
MCHC: 33.8 g/dL (ref 32.0–36.0)
MCV: 91.8 fL (ref 80.0–100.0)
MONO ABS: 0 10*3/uL — AB (ref 0.2–0.9)
Monocytes Relative: 10 %
NEUTROS ABS: 0 10*3/uL — AB (ref 1.4–6.5)
Neutrophils Relative %: 5 %
PLATELETS: 16 10*3/uL — AB (ref 150–440)
RBC: 2.7 MIL/uL — ABNORMAL LOW (ref 3.80–5.20)
RDW: 18.4 % — ABNORMAL HIGH (ref 11.5–14.5)
WBC: 0.4 10*3/uL — CL (ref 3.6–11.0)

## 2016-01-18 LAB — BASIC METABOLIC PANEL
Anion gap: 7 (ref 5–15)
BUN: 18 mg/dL (ref 6–20)
CALCIUM: 9 mg/dL (ref 8.9–10.3)
CHLORIDE: 103 mmol/L (ref 101–111)
CO2: 24 mmol/L (ref 22–32)
CREATININE: 0.94 mg/dL (ref 0.44–1.00)
GFR calc Af Amer: >60 mL/min (ref >60)
GFR calc non Af Amer: 58 mL/min — ABNORMAL LOW (ref >60)
GLUCOSE: 198 mg/dL — AB (ref 65–99)
POTASSIUM: 3.6 mmol/L (ref 3.5–5.1)
SODIUM: 134 mmol/L — AB (ref 135–145)

## 2016-01-18 LAB — SAMPLE TO BLOOD BANK

## 2016-01-21 ENCOUNTER — Telehealth: Payer: Self-pay | Admitting: *Deleted

## 2016-01-21 ENCOUNTER — Inpatient Hospital Stay: Payer: Medicare Other | Admitting: Oncology

## 2016-01-21 ENCOUNTER — Inpatient Hospital Stay: Payer: Medicare Other

## 2016-01-21 NOTE — Telephone Encounter (Signed)
Instructed to go to ER and they can call him if needed

## 2016-01-21 NOTE — Telephone Encounter (Signed)
[place] is [place] the beach and got blood results of 8.4 and feel she has dropped more, asking for a referral to md in [place] [facility]) as they had called and spoken with hospital and were told we need to send a referral to eval adn give blood to her. States he [facility] MD suggested that she not go below 8 on her HGB. She requests that we call her and let her know referral has been sent and where we sent it.

## 2016-01-21 NOTE — Telephone Encounter (Signed)
Dr. Arnell Sieving called to let you know she saw patient in clinic last week. Patient declines transplant at this time. Thanks.

## 2016-01-28 ENCOUNTER — Inpatient Hospital Stay: Payer: Medicare Other

## 2016-01-28 ENCOUNTER — Inpatient Hospital Stay (HOSPITAL_BASED_OUTPATIENT_CLINIC_OR_DEPARTMENT_OTHER): Payer: Medicare Other | Admitting: Oncology

## 2016-01-28 VITALS — BP 119/72 | HR 106 | Temp 97.4°F | Wt 157.5 lb

## 2016-01-28 DIAGNOSIS — Z85828 Personal history of other malignant neoplasm of skin: Secondary | ICD-10-CM

## 2016-01-28 DIAGNOSIS — K649 Unspecified hemorrhoids: Secondary | ICD-10-CM

## 2016-01-28 DIAGNOSIS — Z801 Family history of malignant neoplasm of trachea, bronchus and lung: Secondary | ICD-10-CM

## 2016-01-28 DIAGNOSIS — Z87891 Personal history of nicotine dependence: Secondary | ICD-10-CM

## 2016-01-28 DIAGNOSIS — R490 Dysphonia: Secondary | ICD-10-CM | POA: Diagnosis not present

## 2016-01-28 DIAGNOSIS — R531 Weakness: Secondary | ICD-10-CM

## 2016-01-28 DIAGNOSIS — Z803 Family history of malignant neoplasm of breast: Secondary | ICD-10-CM

## 2016-01-28 DIAGNOSIS — D4622 Refractory anemia with excess of blasts 2: Secondary | ICD-10-CM | POA: Diagnosis not present

## 2016-01-28 DIAGNOSIS — D61818 Other pancytopenia: Secondary | ICD-10-CM | POA: Diagnosis not present

## 2016-01-28 DIAGNOSIS — M8580 Other specified disorders of bone density and structure, unspecified site: Secondary | ICD-10-CM

## 2016-01-28 DIAGNOSIS — R5383 Other fatigue: Secondary | ICD-10-CM

## 2016-01-28 DIAGNOSIS — E785 Hyperlipidemia, unspecified: Secondary | ICD-10-CM

## 2016-01-28 DIAGNOSIS — K219 Gastro-esophageal reflux disease without esophagitis: Secondary | ICD-10-CM

## 2016-01-28 DIAGNOSIS — E049 Nontoxic goiter, unspecified: Secondary | ICD-10-CM

## 2016-01-28 DIAGNOSIS — N390 Urinary tract infection, site not specified: Secondary | ICD-10-CM

## 2016-01-28 DIAGNOSIS — M129 Arthropathy, unspecified: Secondary | ICD-10-CM

## 2016-01-28 DIAGNOSIS — Z79899 Other long term (current) drug therapy: Secondary | ICD-10-CM

## 2016-01-28 LAB — CBC WITH DIFFERENTIAL/PLATELET
BASOS ABS: 0 10*3/uL (ref 0–0.1)
EOS ABS: 0 10*3/uL (ref 0–0.7)
Eosinophils Relative: 1 %
HCT: 27.1 % — ABNORMAL LOW (ref 35.0–47.0)
HEMOGLOBIN: 9.3 g/dL — AB (ref 12.0–16.0)
Lymphocytes Relative: 75 %
Lymphs Abs: 0.5 10*3/uL — ABNORMAL LOW (ref 1.0–3.6)
MCH: 30.4 pg (ref 26.0–34.0)
MCHC: 34.1 g/dL (ref 32.0–36.0)
MCV: 89.1 fL (ref 80.0–100.0)
Monocytes Absolute: 0.1 10*3/uL — ABNORMAL LOW (ref 0.2–0.9)
Monocytes Relative: 17 %
NEUTROS ABS: 0 10*3/uL — AB (ref 1.4–6.5)
PLATELETS: 17 10*3/uL — AB (ref 150–440)
RBC: 3.05 MIL/uL — AB (ref 3.80–5.20)
RDW: 14.4 % (ref 11.5–14.5)
WBC: 0.7 10*3/uL — AB (ref 3.6–11.0)

## 2016-01-28 LAB — BASIC METABOLIC PANEL
Anion gap: 7 (ref 5–15)
BUN: 15 mg/dL (ref 6–20)
CO2: 22 mmol/L (ref 22–32)
CREATININE: 0.81 mg/dL (ref 0.44–1.00)
Calcium: 8.5 mg/dL — ABNORMAL LOW (ref 8.9–10.3)
Chloride: 104 mmol/L (ref 101–111)
GLUCOSE: 153 mg/dL — AB (ref 65–99)
POTASSIUM: 3.4 mmol/L — AB (ref 3.5–5.1)
SODIUM: 133 mmol/L — AB (ref 135–145)

## 2016-01-28 LAB — SAMPLE TO BLOOD BANK

## 2016-01-28 NOTE — Progress Notes (Signed)
Patient ambulates without assistance, brought to exam room 7, accompanied by family.  Vitals documented, medication record updated, information provided by patient and family members.

## 2016-01-29 NOTE — Progress Notes (Signed)
[facility]  Telephone:(336) [phone] Fax:(336) [phone]  ID: JAZMYNE BEAUCHESNE OB: 04-06-1941  MR#: [id number]  EPP#:[id number]  Patient Care Team: Ezequiel Kayser, MD as PCP - General (Internal Medicine)  CHIEF COMPLAINT:  Chief Complaint  Patient presents with  . Follow-up    INTERVAL HISTORY: Patient returns to clinic today for further evaluation and consideration of blood transfusion. She was recently at the beach and was admitted to a local hospital where she received one unit of packed red blood cells as well as one injection of Neupogen. She continues to have increased weakness and fatigue. She has a sore throat and raspy voice today. She denies any recent fevers. She has no neurologic complaints. She denies any chest pain, shortness of breath, cough, or hemoptysis. She denies any abdominal pain. She has no nausea, vomiting, constipation, or diarrhea. Patient offers no further specific complaints today.  REVIEW OF SYSTEMS:   Review of Systems  Constitutional: Positive for malaise/fatigue. Negative for fever, chills, weight loss and diaphoresis.  HENT: Positive for sore throat.   Respiratory: Negative.  Negative for cough and shortness of breath.   Cardiovascular: Negative.  Negative for chest pain.  Gastrointestinal: Negative for abdominal pain, constipation, blood in stool and melena.  Genitourinary: Negative for frequency.  Musculoskeletal: Negative.   Neurological: Positive for weakness.  Psychiatric/Behavioral: Negative.     As per HPI. Otherwise, a complete review of systems is negatve.  PAST MEDICAL HISTORY: Past Medical History  Diagnosis Date  . Cancer ([place])     skin ca  . GERD (gastroesophageal reflux disease)   . Hyperlipidemia   . Osteopenia   . 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 PONV (postoperative nausea and vomiting)   . Arthritis   . Anemia   . 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 Colonoscopy  1999  . Laparoscopic salpingoopherectomy  1968  . Functional endoscopic sinus surgery  2007  . Abdominal surgery  1968    Exploratory. Turned out tubal pregnancy  . Tubal ligation      FAMILY HISTORY Family History  Problem Relation Age of Onset  . Breast cancer Maternal Aunt 65  . Breast cancer Paternal Aunt 56  . Lung cancer Father   . Breast cancer Maternal Aunt   . Heart disease Mother   . Stroke Mother   . Breast cancer Paternal Aunt   . Breast cancer Paternal Aunt   . Colon polyps Sister   . Heart disease Sister   . Rheum arthritis Sister        ADVANCED DIRECTIVES:    HEALTH MAINTENANCE: Social History  Substance Use Topics  . Smoking status: Former Smoker    Quit date: 11/19/1976  . Smokeless tobacco: Never Used  . Alcohol Use: Yes     Comment: rarely     Colonoscopy:  PAP:  Bone density:  Lipid panel:  Allergies  Allergen Reactions  . Codeine Nausea And Vomiting  . Lubiprostone Other (See Comments)    Abdominal cramps  . Statins Other (See Comments)  . Shellfish Allergy     Current Outpatient Prescriptions  Medication Sig Dispense Refill  . docusate sodium (COLACE) 100 MG capsule Take 100 mg by mouth 2 (two) times daily. Reported on 11/27/2015    . fluconazole (DIFLUCAN) 200 MG tablet     . fluticasone (FLONASE) 50 MCG/ACT nasal  spray Place 1-2 sprays into the nose daily as needed for allergies or rhinitis.     . hydrocortisone (ANUSOL-HC) 2.5 % rectal cream Place 1 application rectally 2 (two) times daily. 30 g 2  . levofloxacin (LEVAQUIN) 500 MG tablet Take 500 mg by mouth.    . lidocaine-prilocaine (EMLA) cream Apply 1 application topically as needed. Apply to abdomen 1-2 hours prior to injections. 30 g 5  . loratadine (CLARITIN) 10 MG tablet Take 10 mg by mouth daily as needed for allergies.    Marland Kitchen omeprazole (PRILOSEC) 20 MG capsule Take 20 mg by mouth daily.     .  ondansetron (ZOFRAN) 8 MG tablet Take 1 tablet (8 mg total) by mouth 2 (two) times daily as needed (Nausea or vomiting). 30 tablet 1  . polyethylene glycol powder (GLYCOLAX/MIRALAX) powder Take 17 g by mouth daily as needed. Reported on 11/27/2015    . prochlorperazine (COMPAZINE) 10 MG tablet Take 1 tablet (10 mg total) by mouth every 6 (six) hours as needed (Nausea or vomiting). 30 tablet 1  . valACYclovir (VALTREX) 500 MG tablet Take 500 mg by mouth.     No current facility-administered medications for this visit.    OBJECTIVE: Filed Vitals:   01/28/16 1205  BP: 119/72  Pulse: 106  Temp: 97.4 F (36.3 C)     Body mass index is 27.02 kg/(m^2).    ECOG FS:1 - Symptomatic but completely ambulatory  General: Ill-appearing, no acute distress. Eyes: Pink conjunctiva, anicteric sclera. HEENT: Mild white patches on patient's tonsils. Lungs: Clear to auscultation bilaterally. Heart: Regular rate and rhythm. No rubs, murmurs, or gallops. Abdomen: Soft, nontender, nondistended. No organomegaly noted, normoactive bowel sounds. Musculoskeletal: No edema, cyanosis, or clubbing. Neuro: Alert, answering all questions appropriately. Cranial nerves grossly intact. Skin: No rashes or petechiae noted. Psych: Normal affect.   LAB RESULTS:  Lab Results  Component Value Date   NA 133* 01/28/2016   K 3.4* 01/28/2016   CL 104 01/28/2016   CO2 22 01/28/2016   GLUCOSE 153* 01/28/2016   BUN 15 01/28/2016   CREATININE 0.81 01/28/2016   CALCIUM 8.5* 01/28/2016   PROT 6.9 12/26/2015   ALBUMIN 3.4* 12/26/2015   AST 17 12/26/2015   ALT 17 12/26/2015   ALKPHOS 78 12/26/2015   BILITOT 0.5 12/26/2015   GFRNONAA >60 01/28/2016   GFRAA >60 01/28/2016    Lab Results  Component Value Date   WBC 0.7* 01/28/2016   NEUTROABS 0.0* 01/28/2016   HGB 9.3* 01/28/2016   HCT 27.1* 01/28/2016   MCV 89.1 01/28/2016   PLT 17* 01/28/2016     STUDIES: No results found.  ASSESSMENT: MDS, specifically  refractory anemia with excess blasts-2.   PLAN:    1. RAEB-2: Patient recently evaluated by [facility]. Her diagnosis was confirmed by bone marrow biopsy results. Patient has approximately 15% blasts in her bone marrow. She was also noted to have multilineage dyspoiesis and atypical megakaryocytes. Patient noted to have multiple cytogenetic abnormalities placing her at high risk for conversion to acute leukemia. Patient received cycle 2 of subcutaneous Vidaza 18m/m2/d on days 1 through 5 and days 8 and 9 on a 28 day approximately 2 weeks ago. Will repeat bone marrow biopsy after cycle 4.  Return to clinic on June 29 for laboratory work for consideration of blood transfusion on June 30. Patient is going to the beach for a week, therefore will return on February 11, 2016 to initiate cycle 3 of Vidaza only. Patient is also  taking Levaquin, fluconazole, and Valtrex prophylactically. 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 Patient will also require irradiated blood products. 3. Anemia: Patient does not require blood transfusion this time and we will recheck her later this week to see if one is necessary prior to her trip.  4. Hemorrhoids: Continue current topical treatment. Also recommended scheduled stool softeners as well as MiraLAX to avoid further constipation. 5. White patches on tonsils: Patient is afebrile and already taking prophylactic doses of Levaquin, Valtrex, and fluconazole. Will monitor closely.  Approximately 30 minutes was spent in discussion of which greater than 50% was consultation.  Patient expressed understanding and was in agreement with this plan. She also understands that She can call clinic at any time with any questions, concerns, or complaints.    Lloyd Huger, MD   01/29/2016 11:20 PM

## 2016-01-30 ENCOUNTER — Telehealth: Payer: Self-pay | Admitting: *Deleted

## 2016-01-30 ENCOUNTER — Other Ambulatory Visit: Payer: Self-pay | Admitting: *Deleted

## 2016-01-30 DIAGNOSIS — D4622 Refractory anemia with excess of blasts 2: Secondary | ICD-10-CM

## 2016-01-30 NOTE — Telephone Encounter (Signed)
OK per Dr Grayland Ormond, continue to take Tylenol and monitor temp closely. Left msg on VM for patient

## 2016-01-30 NOTE — Telephone Encounter (Signed)
Called to report that her temp which is usually 97.6 was up to 99.1 last night and she took Tylenol and it came down. It is normal this morning

## 2016-01-31 ENCOUNTER — Telehealth: Payer: Self-pay | Admitting: *Deleted

## 2016-01-31 ENCOUNTER — Inpatient Hospital Stay: Payer: Medicare Other

## 2016-01-31 ENCOUNTER — Other Ambulatory Visit: Payer: Self-pay | Admitting: *Deleted

## 2016-01-31 DIAGNOSIS — R509 Fever, unspecified: Secondary | ICD-10-CM

## 2016-01-31 DIAGNOSIS — D4622 Refractory anemia with excess of blasts 2: Secondary | ICD-10-CM

## 2016-01-31 LAB — CBC WITH DIFFERENTIAL/PLATELET
BASOS ABS: 0 10*3/uL (ref 0–0.1)
EOS ABS: 0 10*3/uL (ref 0–0.7)
Eosinophils Relative: 1 %
HEMATOCRIT: 26.8 % — AB (ref 35.0–47.0)
HEMOGLOBIN: 9.2 g/dL — AB (ref 12.0–16.0)
Lymphocytes Relative: 61 %
Lymphs Abs: 0.5 10*3/uL — ABNORMAL LOW (ref 1.0–3.6)
MCH: 30.4 pg (ref 26.0–34.0)
MCHC: 34.3 g/dL (ref 32.0–36.0)
MCV: 88.8 fL (ref 80.0–100.0)
Monocytes Absolute: 0.2 10*3/uL (ref 0.2–0.9)
Monocytes Relative: 27 %
NEUTROS ABS: 0.1 10*3/uL — AB (ref 1.7–7.7)
Platelets: 24 10*3/uL — CL (ref 150–440)
RBC: 3.02 MIL/uL — ABNORMAL LOW (ref 3.80–5.20)
RDW: 14.5 % (ref 11.5–14.5)
WBC: 0.9 10*3/uL — AB (ref 3.6–11.0)

## 2016-01-31 LAB — SAMPLE TO BLOOD BANK

## 2016-01-31 MED ORDER — AMOXICILLIN-POT CLAVULANATE 875-125 MG PO TABS: 1.0000 | ORAL_TABLET | Freq: Two times a day (BID) | ORAL | Status: DC

## 2016-01-31 NOTE — Telephone Encounter (Signed)
Pt continues to have low grade temp with sore throat and white patches in back of throat. Per Dr. Grayland Ormond, pt will need to stop levaquin and start taking augmentin BID x 7 days. Rx sent to [facility] in [place]. Pt instructed to go to Urgent Care while at the beach in case symptoms worsen. Pt verbalized understanding.

## 2016-02-01 ENCOUNTER — Inpatient Hospital Stay: Payer: Medicare Other

## 2016-02-11 ENCOUNTER — Inpatient Hospital Stay: Payer: Medicare Other

## 2016-02-11 ENCOUNTER — Inpatient Hospital Stay: Payer: Medicare Other | Attending: Oncology | Admitting: Oncology

## 2016-02-11 VITALS — BP 125/80 | HR 110 | Temp 97.6°F | Wt 157.7 lb

## 2016-02-11 DIAGNOSIS — Z85828 Personal history of other malignant neoplasm of skin: Secondary | ICD-10-CM

## 2016-02-11 DIAGNOSIS — E785 Hyperlipidemia, unspecified: Secondary | ICD-10-CM

## 2016-02-11 DIAGNOSIS — Z5111 Encounter for antineoplastic chemotherapy: Secondary | ICD-10-CM | POA: Diagnosis not present

## 2016-02-11 DIAGNOSIS — Z87891 Personal history of nicotine dependence: Secondary | ICD-10-CM

## 2016-02-11 DIAGNOSIS — Z79899 Other long term (current) drug therapy: Secondary | ICD-10-CM | POA: Diagnosis not present

## 2016-02-11 DIAGNOSIS — Z801 Family history of malignant neoplasm of trachea, bronchus and lung: Secondary | ICD-10-CM | POA: Diagnosis not present

## 2016-02-11 DIAGNOSIS — K219 Gastro-esophageal reflux disease without esophagitis: Secondary | ICD-10-CM | POA: Diagnosis not present

## 2016-02-11 DIAGNOSIS — D61818 Other pancytopenia: Secondary | ICD-10-CM

## 2016-02-11 DIAGNOSIS — J029 Acute pharyngitis, unspecified: Secondary | ICD-10-CM

## 2016-02-11 DIAGNOSIS — K649 Unspecified hemorrhoids: Secondary | ICD-10-CM | POA: Diagnosis not present

## 2016-02-11 DIAGNOSIS — M129 Arthropathy, unspecified: Secondary | ICD-10-CM | POA: Diagnosis not present

## 2016-02-11 DIAGNOSIS — Z803 Family history of malignant neoplasm of breast: Secondary | ICD-10-CM

## 2016-02-11 DIAGNOSIS — D4622 Refractory anemia with excess of blasts 2: Secondary | ICD-10-CM | POA: Diagnosis present

## 2016-02-11 DIAGNOSIS — R5383 Other fatigue: Secondary | ICD-10-CM

## 2016-02-11 DIAGNOSIS — M8580 Other specified disorders of bone density and structure, unspecified site: Secondary | ICD-10-CM | POA: Diagnosis not present

## 2016-02-11 DIAGNOSIS — R531 Weakness: Secondary | ICD-10-CM

## 2016-02-11 LAB — BASIC METABOLIC PANEL
ANION GAP: 6 (ref 5–15)
BUN: 16 mg/dL (ref 6–20)
CO2: 23 mmol/L (ref 22–32)
Calcium: 8.4 mg/dL — ABNORMAL LOW (ref 8.9–10.3)
Chloride: 104 mmol/L (ref 101–111)
Creatinine, Ser: 0.58 mg/dL (ref 0.44–1.00)
GFR calc non Af Amer: >60 mL/min (ref >60)
GLUCOSE: 193 mg/dL — AB (ref 65–99)
POTASSIUM: 3.4 mmol/L — AB (ref 3.5–5.1)
Sodium: 133 mmol/L — ABNORMAL LOW (ref 135–145)

## 2016-02-11 LAB — CBC WITH DIFFERENTIAL/PLATELET
BASOS ABS: 0 10*3/uL (ref 0–0.1)
Eosinophils Absolute: 0 10*3/uL (ref 0–0.7)
Eosinophils Relative: 1 %
HEMATOCRIT: 23 % — AB (ref 35.0–47.0)
HEMOGLOBIN: 7.8 g/dL — AB (ref 12.0–16.0)
Lymphs Abs: 0.6 10*3/uL — ABNORMAL LOW (ref 1.0–3.6)
MCH: 30.7 pg (ref 26.0–34.0)
MCHC: 33.9 g/dL (ref 32.0–36.0)
MCV: 90.5 fL (ref 80.0–100.0)
Monocytes Absolute: 0.1 10*3/uL — ABNORMAL LOW (ref 0.2–0.9)
Monocytes Relative: 14 %
NEUTROS ABS: 0 10*3/uL — AB (ref 1.4–6.5)
Platelets: 47 10*3/uL — ABNORMAL LOW (ref 150–440)
RBC: 2.54 MIL/uL — ABNORMAL LOW (ref 3.80–5.20)
RDW: 14.2 % (ref 11.5–14.5)
WBC: 0.8 10*3/uL — CL (ref 3.6–11.0)

## 2016-02-11 LAB — SAMPLE TO BLOOD BANK

## 2016-02-11 MED ORDER — AZACITIDINE CHEMO SQ INJECTION
75.0000 mg/m2 | Freq: Once | INTRAMUSCULAR | Status: AC
  Administered 2016-02-11: 140 mg via SUBCUTANEOUS
  Filled 2016-02-11: qty 5.6

## 2016-02-11 MED ORDER — ONDANSETRON HCL 4 MG PO TABS
8.0000 mg | ORAL_TABLET | Freq: Once | ORAL | Status: AC
  Administered 2016-02-11: 8 mg via ORAL
  Filled 2016-02-11: qty 2

## 2016-02-11 NOTE — Progress Notes (Signed)
Patient ambulates without assistance, brought to exam room 7.  Patient denies pain or discomfort at this time.  BP 125/80, HR 110, vitals documented.  Medication record updated, information provided by patient.

## 2016-02-11 NOTE — Progress Notes (Signed)
[place]  [phone] Fax:([phone]) [phone]  ID: Kathryn Boyd OB: [birth year]  MR#: [id number]  PNT#:[id number]  Patient Care Team: Ezequiel Kayser, MD as PCP - General (Internal Medicine)  CHIEF COMPLAINT: MDS, specifically refractory anemia with excess blasts-2.  INTERVAL HISTORY: Patient returns to clinic today for further evaluation and consideration of cycle 3 of Vidaza. She continues to have increased weakness and fatigue. She has low-grade fevers, but none greater than 100.0. Her sore throat has improved, although she continues to have a raspy voice. She has no neurologic complaints. She denies any chest pain, shortness of breath, cough, or hemoptysis. She denies any abdominal pain. She has no nausea, vomiting, constipation, or diarrhea. Patient offers no further specific complaints today.  REVIEW OF SYSTEMS:   Review of Systems  Constitutional: Positive for malaise/fatigue. Negative for fever, chills, weight loss and diaphoresis.  HENT: Negative for sore throat.   Respiratory: Negative.  Negative for cough and shortness of breath.   Cardiovascular: Negative.  Negative for chest pain.  Gastrointestinal: Negative for abdominal pain, constipation, blood in stool and melena.  Genitourinary: Negative for frequency.  Musculoskeletal: Negative.   Neurological: Positive for weakness.  Psychiatric/Behavioral: Negative.     As per HPI. Otherwise, a complete review of systems is negatve.  PAST MEDICAL HISTORY: Past Medical History  Diagnosis Date  . Cancer ([place])     skin ca  . GERD (gastroesophageal reflux disease)   . Hyperlipidemia   . Osteopenia   . 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 PONV (postoperative nausea and vomiting)   . Arthritis   . Anemia   . 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 Colonoscopy  1999  . Laparoscopic  salpingoopherectomy  1968  . Functional endoscopic sinus surgery  2007  . Abdominal surgery  1968    Exploratory. Turned out tubal pregnancy  . Tubal ligation      FAMILY HISTORY Family History  Problem Relation Age of Onset  . Breast cancer Maternal Aunt 65  . Breast cancer Paternal Aunt 67  . Lung cancer Father   . Breast cancer Maternal Aunt   . Heart disease Mother   . Stroke Mother   . Breast cancer Paternal Aunt   . Breast cancer Paternal Aunt   . Colon polyps Sister   . Heart disease Sister   . Rheum arthritis Sister        ADVANCED DIRECTIVES:    HEALTH MAINTENANCE: Social History  Substance Use Topics  . Smoking status: Former Smoker    Quit date: 11/19/1976  . Smokeless tobacco: Never Used  . Alcohol Use: Yes     Comment: rarely     Colonoscopy:  PAP:  Bone density:  Lipid panel:  Allergies  Allergen Reactions  . Codeine Nausea And Vomiting  . Lubiprostone Other (See Comments)    Abdominal cramps  . Statins Other (See Comments)  . Shellfish Allergy     Current Outpatient Prescriptions  Medication Sig Dispense Refill  . docusate sodium (COLACE) 100 MG capsule Take 100 mg by mouth 2 (two) times daily. Reported on 11/27/2015    . fluconazole (DIFLUCAN) 200 MG tablet Reported on 02/11/2016    . hydrocortisone (ANUSOL-HC) 2.5 % rectal cream Place 1 application rectally 2 (two) times daily. 30 g 2  . lidocaine-prilocaine (EMLA) cream Apply 1 application topically as needed.  Apply to abdomen 1-2 hours prior to injections. 30 g 5  . loratadine (CLARITIN) 10 MG tablet Take 10 mg by mouth daily as needed for allergies.    Marland Kitchen omeprazole (PRILOSEC) 20 MG capsule Take 20 mg by mouth daily.     . ondansetron (ZOFRAN) 8 MG tablet Take 1 tablet (8 mg total) by mouth 2 (two) times daily as needed (Nausea or vomiting). 30 tablet 1  . polyethylene glycol powder (GLYCOLAX/MIRALAX) powder Take 17 g by mouth daily as needed. Reported on 11/27/2015    . prochlorperazine  (COMPAZINE) 10 MG tablet Take 1 tablet (10 mg total) by mouth every 6 (six) hours as needed (Nausea or vomiting). 30 tablet 1  . diphenhydrAMINE (BENADRYL) 12.5 MG/5ML elixir Take 12.5 mg by mouth.    . fluticasone (FLONASE) 50 MCG/ACT nasal spray Place 1-2 sprays into the nose daily as needed for allergies or rhinitis. Reported on 02/11/2016     No current facility-administered medications for this visit.    OBJECTIVE: Filed Vitals:   02/11/16 1128  BP: 125/80  Pulse: 110  Temp: 97.6 F (36.4 C)     Body mass index is 27.06 kg/(m^2).    ECOG FS:1 - Symptomatic but completely ambulatory  General: Ill-appearing, no acute distress. Eyes: Pink conjunctiva, anicteric sclera. HEENT: Mild white patches on patient's tonsils. Lungs: Clear to auscultation bilaterally. Heart: Regular rate and rhythm. No rubs, murmurs, or gallops. Abdomen: Soft, nontender, nondistended. No organomegaly noted, normoactive bowel sounds. Musculoskeletal: No edema, cyanosis, or clubbing. Neuro: Alert, answering all questions appropriately. Cranial nerves grossly intact. Skin: No rashes or petechiae noted. Psych: Normal affect.   LAB RESULTS:  Lab Results  Component Value Date   NA 133* 02/11/2016   K 3.4* 02/11/2016   CL 104 02/11/2016   CO2 23 02/11/2016   GLUCOSE 193* 02/11/2016   BUN 16 02/11/2016   CREATININE 0.58 02/11/2016   CALCIUM 8.4* 02/11/2016   PROT 6.9 12/26/2015   ALBUMIN 3.4* 12/26/2015   AST 17 12/26/2015   ALT 17 12/26/2015   ALKPHOS 78 12/26/2015   BILITOT 0.5 12/26/2015   GFRNONAA >60 02/11/2016   GFRAA >60 02/11/2016    Lab Results  Component Value Date   WBC 0.8* 02/11/2016   NEUTROABS 0.0* 02/11/2016   HGB 7.8* 02/11/2016   HCT 23.0* 02/11/2016   MCV 90.5 02/11/2016   PLT 47* 02/11/2016     STUDIES: No results found.  ASSESSMENT: MDS, specifically refractory anemia with excess blasts-2.   PLAN:    1. MDS, specifically refractory anemia with excess blasts-2:  Patient recently evaluated by [facility]. Her diagnosis was confirmed by bone marrow biopsy results. Patient has approximately 15% blasts in her bone marrow. She was also noted to have multilineage dyspoiesis and atypical megakaryocytes. Patient noted to have multiple cytogenetic abnormalities placing her at high risk for conversion to acute leukemia. Proceed with cycle 3 of subcutaneous Vidaza 58m/m2/d on days 1 through 5 and days 8 and 9 on a 28 day today. [facility] be [facility] prior to cycle 4 on March 03, 2016. Return to clinic for VLuanaas scheduled, weekly for laboratory work and consideration of blood transfusion, and then on March 10, 2016 for consideration of cycle 4 Vidaza if bone marrow biopsy results are adequate to proceed. Patient is also taking Levaquin, fluconazole, and Valtrex prophylactically. 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 Patient will also require irradiated  blood products. 3. Anemia: Patient will return to clinic later this week to receive one unit of irradiated packed red blood cells.  4. Hemorrhoids: Continue current topical treatment. Also recommended scheduled stool softeners as well as MiraLAX to avoid further constipation. 5. White patches on tonsils: Patient is afebrile and already taking prophylactic doses of Levaquin, Valtrex, and fluconazole. Will monitor closely.   Patient expressed understanding and was in agreement with this plan. She also understands that She can call clinic at any time with any questions, concerns, or complaints.    Lloyd Huger, MD   02/11/2016 4:46 PM

## 2016-02-12 ENCOUNTER — Inpatient Hospital Stay: Payer: Medicare Other

## 2016-02-12 ENCOUNTER — Other Ambulatory Visit: Payer: Self-pay | Admitting: Oncology

## 2016-02-12 ENCOUNTER — Other Ambulatory Visit: Payer: Self-pay | Admitting: *Deleted

## 2016-02-12 VITALS — BP 113/73 | HR 101 | Temp 98.5°F | Resp 18

## 2016-02-12 DIAGNOSIS — D4622 Refractory anemia with excess of blasts 2: Secondary | ICD-10-CM | POA: Diagnosis not present

## 2016-02-12 DIAGNOSIS — D469 Myelodysplastic syndrome, unspecified: Secondary | ICD-10-CM

## 2016-02-12 LAB — PREPARE RBC (CROSSMATCH)

## 2016-02-12 MED ORDER — AZACITIDINE CHEMO SQ INJECTION
75.0000 mg/m2 | Freq: Once | INTRAMUSCULAR | Status: AC
  Administered 2016-02-12: 140 mg via SUBCUTANEOUS
  Filled 2016-02-12: qty 5.6

## 2016-02-12 MED ORDER — ACETAMINOPHEN 325 MG PO TABS
650.0000 mg | ORAL_TABLET | Freq: Once | ORAL | Status: AC
  Administered 2016-02-12: 650 mg via ORAL
  Filled 2016-02-12: qty 2

## 2016-02-12 MED ORDER — DIPHENHYDRAMINE HCL 50 MG/ML IJ SOLN
25.0000 mg | Freq: Once | INTRAMUSCULAR | Status: AC
  Administered 2016-02-12: 25 mg via INTRAVENOUS
  Filled 2016-02-12: qty 1

## 2016-02-12 MED ORDER — ONDANSETRON HCL 4 MG PO TABS
8.0000 mg | ORAL_TABLET | Freq: Once | ORAL | Status: AC
  Administered 2016-02-12: 8 mg via ORAL
  Filled 2016-02-12: qty 2

## 2016-02-12 MED ORDER — SODIUM CHLORIDE 0.9 % IV SOLN
INTRAVENOUS | Status: DC
  Administered 2016-02-12: 11:00:00 via INTRAVENOUS
  Filled 2016-02-12: qty 1000

## 2016-02-13 ENCOUNTER — Inpatient Hospital Stay: Payer: Medicare Other

## 2016-02-13 VITALS — BP 108/70 | HR 105 | Temp 97.1°F | Resp 18

## 2016-02-13 DIAGNOSIS — D4622 Refractory anemia with excess of blasts 2: Secondary | ICD-10-CM

## 2016-02-13 LAB — TYPE AND SCREEN
ABO/RH(D): O POS
Antibody Screen: NEGATIVE
Unit division: 0

## 2016-02-13 MED ORDER — AZACITIDINE CHEMO SQ INJECTION
75.0000 mg/m2 | Freq: Once | INTRAMUSCULAR | Status: AC
  Administered 2016-02-13: 140 mg via SUBCUTANEOUS
  Filled 2016-02-13: qty 5.6

## 2016-02-13 MED ORDER — ONDANSETRON HCL 4 MG PO TABS
8.0000 mg | ORAL_TABLET | Freq: Once | ORAL | Status: AC
  Administered 2016-02-13: 8 mg via ORAL
  Filled 2016-02-13: qty 2

## 2016-02-14 ENCOUNTER — Inpatient Hospital Stay: Payer: Medicare Other

## 2016-02-14 ENCOUNTER — Telehealth: Payer: Self-pay | Admitting: *Deleted

## 2016-02-14 DIAGNOSIS — D4622 Refractory anemia with excess of blasts 2: Secondary | ICD-10-CM

## 2016-02-14 LAB — CBC WITH DIFFERENTIAL/PLATELET
Basophils Absolute: 0 10*3/uL (ref 0–0.1)
EOS ABS: 0 10*3/uL (ref 0–0.7)
Eosinophils Relative: 3 %
HCT: 27.1 % — ABNORMAL LOW (ref 35.0–47.0)
Hemoglobin: 9.4 g/dL — ABNORMAL LOW (ref 12.0–16.0)
Lymphocytes Relative: 76 %
Lymphs Abs: 0.4 10*3/uL — ABNORMAL LOW (ref 1.0–3.6)
MCH: 31.1 pg (ref 26.0–34.0)
MCHC: 34.6 g/dL (ref 32.0–36.0)
MCV: 89.9 fL (ref 80.0–100.0)
MONO ABS: 0.1 10*3/uL — AB (ref 0.2–0.9)
Neutro Abs: 0 10*3/uL — ABNORMAL LOW (ref 1.4–6.5)
PLATELETS: 42 10*3/uL — AB (ref 150–440)
RBC: 3.02 MIL/uL — ABNORMAL LOW (ref 3.80–5.20)
RDW: 16 % — ABNORMAL HIGH (ref 11.5–14.5)
WBC: 0.6 10*3/uL — CL (ref 3.6–11.0)

## 2016-02-14 LAB — BASIC METABOLIC PANEL
Anion gap: 5 (ref 5–15)
BUN: 15 mg/dL (ref 6–20)
CO2: 24 mmol/L (ref 22–32)
CREATININE: 0.64 mg/dL (ref 0.44–1.00)
Calcium: 8.8 mg/dL — ABNORMAL LOW (ref 8.9–10.3)
Chloride: 102 mmol/L (ref 101–111)
GFR calc Af Amer: >60 mL/min (ref >60)
Glucose, Bld: 177 mg/dL — ABNORMAL HIGH (ref 65–99)
Potassium: 4 mmol/L (ref 3.5–5.1)
SODIUM: 131 mmol/L — AB (ref 135–145)

## 2016-02-14 LAB — SAMPLE TO BLOOD BANK

## 2016-02-14 MED ORDER — AZACITIDINE CHEMO SQ INJECTION
75.0000 mg/m2 | Freq: Once | INTRAMUSCULAR | Status: AC
  Administered 2016-02-14: 140 mg via SUBCUTANEOUS
  Filled 2016-02-14: qty 5.6

## 2016-02-14 MED ORDER — ONDANSETRON HCL 4 MG PO TABS
8.0000 mg | ORAL_TABLET | Freq: Once | ORAL | Status: AC
  Administered 2016-02-14: 8 mg via ORAL
  Filled 2016-02-14: qty 2

## 2016-02-14 NOTE — Telephone Encounter (Signed)
Called pt to inform her that hgb is 9.4 and does not need blood transfusion on 02/15/16. Pt verbalized understanding.

## 2016-02-14 NOTE — Telephone Encounter (Signed)
FYI, critical result of ANC 0.0 and WBC 0.6.

## 2016-02-15 ENCOUNTER — Inpatient Hospital Stay: Payer: Medicare Other

## 2016-02-15 VITALS — BP 123/81 | HR 103 | Temp 97.2°F | Resp 18

## 2016-02-15 DIAGNOSIS — D4622 Refractory anemia with excess of blasts 2: Secondary | ICD-10-CM | POA: Diagnosis not present

## 2016-02-15 MED ORDER — ONDANSETRON HCL 4 MG PO TABS
8.0000 mg | ORAL_TABLET | Freq: Once | ORAL | Status: AC
  Administered 2016-02-15: 8 mg via ORAL
  Filled 2016-02-15: qty 2

## 2016-02-15 MED ORDER — AZACITIDINE CHEMO SQ INJECTION
75.0000 mg/m2 | Freq: Once | INTRAMUSCULAR | Status: AC
  Administered 2016-02-15: 140 mg via SUBCUTANEOUS
  Filled 2016-02-15: qty 5.6

## 2016-02-18 ENCOUNTER — Telehealth: Payer: Self-pay | Admitting: *Deleted

## 2016-02-18 ENCOUNTER — Inpatient Hospital Stay: Payer: Medicare Other

## 2016-02-18 VITALS — BP 109/75 | HR 116 | Temp 97.8°F | Resp 20

## 2016-02-18 DIAGNOSIS — D4622 Refractory anemia with excess of blasts 2: Secondary | ICD-10-CM | POA: Diagnosis not present

## 2016-02-18 LAB — CBC WITH DIFFERENTIAL/PLATELET
Basophils Absolute: 0 10*3/uL (ref 0–0.1)
Basophils Relative: 0 %
Eosinophils Absolute: 0 10*3/uL (ref 0–0.7)
Eosinophils Relative: 0 %
HEMATOCRIT: 25.5 % — AB (ref 35.0–47.0)
Hemoglobin: 8.9 g/dL — ABNORMAL LOW (ref 12.0–16.0)
LYMPHS ABS: 0.4 10*3/uL — AB (ref 1.0–3.6)
MCH: 31.5 pg (ref 26.0–34.0)
MCHC: 34.9 g/dL (ref 32.0–36.0)
MCV: 90.2 fL (ref 80.0–100.0)
MONO ABS: 0 10*3/uL — AB (ref 0.2–0.9)
NEUTROS ABS: 0 10*3/uL — AB (ref 1.4–6.5)
Neutrophils Relative %: 4 %
Platelets: 30 10*3/uL — ABNORMAL LOW (ref 150–440)
RBC: 2.83 MIL/uL — ABNORMAL LOW (ref 3.80–5.20)
RDW: 15.8 % — AB (ref 11.5–14.5)
WBC: 0.5 10*3/uL — CL (ref 3.6–11.0)

## 2016-02-18 LAB — BASIC METABOLIC PANEL
Anion gap: 5 (ref 5–15)
BUN: 18 mg/dL (ref 6–20)
CALCIUM: 8.7 mg/dL — AB (ref 8.9–10.3)
CO2: 23 mmol/L (ref 22–32)
CREATININE: 0.77 mg/dL (ref 0.44–1.00)
Chloride: 104 mmol/L (ref 101–111)
GFR calc Af Amer: >60 mL/min (ref >60)
GFR calc non Af Amer: >60 mL/min (ref >60)
GLUCOSE: 199 mg/dL — AB (ref 65–99)
Potassium: 3.6 mmol/L (ref 3.5–5.1)
Sodium: 132 mmol/L — ABNORMAL LOW (ref 135–145)

## 2016-02-18 LAB — SAMPLE TO BLOOD BANK

## 2016-02-18 MED ORDER — ONDANSETRON HCL 4 MG PO TABS
8.0000 mg | ORAL_TABLET | Freq: Once | ORAL | Status: AC
  Administered 2016-02-18: 8 mg via ORAL
  Filled 2016-02-18: qty 2

## 2016-02-18 MED ORDER — AZACITIDINE CHEMO SQ INJECTION
75.0000 mg/m2 | Freq: Once | INTRAMUSCULAR | Status: AC
  Administered 2016-02-18: 140 mg via SUBCUTANEOUS
  Filled 2016-02-18: qty 5.6

## 2016-02-18 NOTE — Telephone Encounter (Signed)
Received critical result from cancer center lab that pt's [place] is 0.0

## 2016-02-19 ENCOUNTER — Encounter: Payer: Self-pay | Admitting: Emergency Medicine

## 2016-02-19 ENCOUNTER — Emergency Department: Payer: Medicare Other

## 2016-02-19 ENCOUNTER — Inpatient Hospital Stay: Payer: Medicare Other

## 2016-02-19 ENCOUNTER — Inpatient Hospital Stay
Admission: EM | Admit: 2016-02-19 | Discharge: 2016-02-21 | DRG: 808 | Disposition: A | Discharge status: Home / Self Care | Payer: Medicare Other | Attending: Internal Medicine | Admitting: Internal Medicine

## 2016-02-19 VITALS — BP 116/78 | HR 120 | Temp 99.6°F | Resp 24

## 2016-02-19 DIAGNOSIS — Z8261 Family history of arthritis: Secondary | ICD-10-CM

## 2016-02-19 DIAGNOSIS — Z888 Allergy status to other drugs, medicaments and biological substances status: Secondary | ICD-10-CM

## 2016-02-19 DIAGNOSIS — M8580 Other specified disorders of bone density and structure, unspecified site: Secondary | ICD-10-CM | POA: Diagnosis present

## 2016-02-19 DIAGNOSIS — Z801 Family history of malignant neoplasm of trachea, bronchus and lung: Secondary | ICD-10-CM

## 2016-02-19 DIAGNOSIS — D61818 Other pancytopenia: Secondary | ICD-10-CM | POA: Diagnosis present

## 2016-02-19 DIAGNOSIS — D709 Neutropenia, unspecified: Secondary | ICD-10-CM | POA: Diagnosis not present

## 2016-02-19 DIAGNOSIS — M1990 Unspecified osteoarthritis, unspecified site: Secondary | ICD-10-CM | POA: Diagnosis present

## 2016-02-19 DIAGNOSIS — Z79899 Other long term (current) drug therapy: Secondary | ICD-10-CM

## 2016-02-19 DIAGNOSIS — Z803 Family history of malignant neoplasm of breast: Secondary | ICD-10-CM

## 2016-02-19 DIAGNOSIS — Z886 Allergy status to analgesic agent status: Secondary | ICD-10-CM

## 2016-02-19 DIAGNOSIS — D4622 Refractory anemia with excess of blasts 2: Secondary | ICD-10-CM

## 2016-02-19 DIAGNOSIS — R000 Tachycardia, unspecified: Secondary | ICD-10-CM | POA: Diagnosis present

## 2016-02-19 DIAGNOSIS — Z6827 Body mass index (BMI) 27.0-27.9, adult: Secondary | ICD-10-CM

## 2016-02-19 DIAGNOSIS — R040 Epistaxis: Secondary | ICD-10-CM | POA: Diagnosis present

## 2016-02-19 DIAGNOSIS — E43 Unspecified severe protein-calorie malnutrition: Secondary | ICD-10-CM | POA: Insufficient documentation

## 2016-02-19 DIAGNOSIS — Z9851 Tubal ligation status: Secondary | ICD-10-CM

## 2016-02-19 DIAGNOSIS — Z823 Family history of stroke: Secondary | ICD-10-CM

## 2016-02-19 DIAGNOSIS — E785 Hyperlipidemia, unspecified: Secondary | ICD-10-CM | POA: Diagnosis present

## 2016-02-19 DIAGNOSIS — K219 Gastro-esophageal reflux disease without esophagitis: Secondary | ICD-10-CM | POA: Diagnosis present

## 2016-02-19 DIAGNOSIS — Z87891 Personal history of nicotine dependence: Secondary | ICD-10-CM

## 2016-02-19 DIAGNOSIS — Z9889 Other specified postprocedural states: Secondary | ICD-10-CM

## 2016-02-19 DIAGNOSIS — R5081 Fever presenting with conditions classified elsewhere: Secondary | ICD-10-CM | POA: Diagnosis present

## 2016-02-19 DIAGNOSIS — Z7951 Long term (current) use of inhaled steroids: Secondary | ICD-10-CM

## 2016-02-19 DIAGNOSIS — Z9221 Personal history of antineoplastic chemotherapy: Secondary | ICD-10-CM

## 2016-02-19 DIAGNOSIS — Z8249 Family history of ischemic heart disease and other diseases of the circulatory system: Secondary | ICD-10-CM

## 2016-02-19 DIAGNOSIS — Z8371 Family history of colonic polyps: Secondary | ICD-10-CM

## 2016-02-19 DIAGNOSIS — Z66 Do not resuscitate: Secondary | ICD-10-CM | POA: Diagnosis present

## 2016-02-19 LAB — COMPREHENSIVE METABOLIC PANEL
ALT: 14 U/L (ref 14–54)
AST: 22 U/L (ref 15–41)
Albumin: 3 g/dL — ABNORMAL LOW (ref 3.5–5.0)
Alkaline Phosphatase: 83 U/L (ref 38–126)
Anion gap: 6 (ref 5–15)
BILIRUBIN TOTAL: 0.4 mg/dL (ref 0.3–1.2)
BUN: 16 mg/dL (ref 6–20)
CHLORIDE: 104 mmol/L (ref 101–111)
CO2: 23 mmol/L (ref 22–32)
CREATININE: 0.78 mg/dL (ref 0.44–1.00)
Calcium: 9 mg/dL (ref 8.9–10.3)
Glucose, Bld: 178 mg/dL — ABNORMAL HIGH (ref 65–99)
POTASSIUM: 3.9 mmol/L (ref 3.5–5.1)
Sodium: 133 mmol/L — ABNORMAL LOW (ref 135–145)
TOTAL PROTEIN: 6.8 g/dL (ref 6.5–8.1)

## 2016-02-19 LAB — LACTIC ACID, PLASMA: Lactic Acid, Venous: 1.8 mmol/L (ref 0.5–1.9)

## 2016-02-19 MED ORDER — SODIUM CHLORIDE 0.9 % IV BOLUS (SEPSIS)
1000.0000 mL | Freq: Once | INTRAVENOUS | Status: AC
  Administered 2016-02-19: 1000 mL via INTRAVENOUS

## 2016-02-19 MED ORDER — SODIUM CHLORIDE 0.9 % IV BOLUS (SEPSIS)
1000.0000 mL | Freq: Once | INTRAVENOUS | Status: AC
  Administered 2016-02-20: 1000 mL via INTRAVENOUS

## 2016-02-19 MED ORDER — AZACITIDINE CHEMO SQ INJECTION
75.0000 mg/m2 | Freq: Once | INTRAMUSCULAR | Status: AC
  Administered 2016-02-19: 140 mg via SUBCUTANEOUS
  Filled 2016-02-19: qty 5.6

## 2016-02-19 MED ORDER — SODIUM CHLORIDE 0.9 % IV BOLUS (SEPSIS)
250.0000 mL | Freq: Once | INTRAVENOUS | Status: AC
  Administered 2016-02-20: 250 mL via INTRAVENOUS

## 2016-02-19 MED ORDER — ONDANSETRON HCL 4 MG PO TABS
8.0000 mg | ORAL_TABLET | Freq: Once | ORAL | Status: AC
  Administered 2016-02-19: 8 mg via ORAL
  Filled 2016-02-19: qty 2

## 2016-02-19 MED ORDER — PIPERACILLIN-TAZOBACTAM 3.375 G IVPB 30 MIN
3.3750 g | Freq: Once | INTRAVENOUS | Status: AC
  Administered 2016-02-19: 3.375 g via INTRAVENOUS
  Filled 2016-02-19: qty 50

## 2016-02-19 MED ORDER — VANCOMYCIN HCL IN DEXTROSE 1-5 GM/200ML-% IV SOLN
1000.0000 mg | Freq: Once | INTRAVENOUS | Status: AC
  Administered 2016-02-20: 1000 mg via INTRAVENOUS
  Filled 2016-02-19: qty 200

## 2016-02-19 NOTE — Progress Notes (Signed)
Pt here for Vidaza. Temp 99.6 and HR 120. Dr Grayland Ormond aware. Proceed with treatment

## 2016-02-19 NOTE — ED Provider Notes (Signed)
[facility] Emergency Department Provider Note  ____________________________________________  Time seen: 11:00 PM  I have reviewed the triage vital signs and the nursing notes.   HISTORY  Chief Complaint Fever    HPI Kathryn Boyd is a 75 y.o. female with history of myelodysplasia presents to the emergency department referred by Dr. Grayland Ormond for neutropenic fever. Patient states her temperature was 100.4 noted tonight at 9 PM. Of note patient's last chemotherapy treatment was yesterday.     Past Medical History  Diagnosis Date  . Cancer ([place])     skin ca  . GERD (gastroesophageal reflux disease)   . Hyperlipidemia   . Osteopenia   . 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 PONV (postoperative nausea and vomiting)   . Arthritis   . Anemia   . Myeloid dysplasia [facility])     Patient Active Problem List   Diagnosis Date Noted  . UTI (lower urinary tract infection) 12/26/2015  . 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 Colonoscopy  1999  . Laparoscopic salpingoopherectomy  1968  . Functional endoscopic sinus surgery  2007  . Abdominal surgery  1968    Exploratory. Turned out tubal pregnancy  . Tubal ligation      Current Outpatient Rx  Name  Route  Sig  Dispense  Refill  . diphenhydrAMINE (BENADRYL) 25 MG tablet   Oral   Take 25 mg by mouth at bedtime as needed for sleep.         Marland Kitchen docusate sodium (COLACE) 100 MG capsule   Oral   Take 100 mg by mouth 2 (two) times daily. Reported on 11/27/2015         . fluconazole (DIFLUCAN) 200 MG tablet   Oral   Take 200 mg by mouth 2 (two) times daily.          . fluticasone (FLONASE) 50 MCG/ACT nasal spray   Nasal   Place 1-2 sprays into the nose daily as needed for allergies or rhinitis. Reported on 02/11/2016         . levofloxacin (LEVAQUIN) 500 MG tablet   Oral   Take 500 mg  by mouth daily.         Marland Kitchen lidocaine-prilocaine (EMLA) cream   Topical   Apply 1 application topically as needed. Apply to abdomen 1-2 hours prior to injections.   30 g   5   . loratadine (CLARITIN) 10 MG tablet   Oral   Take 10 mg by mouth daily as needed for allergies.         Marland Kitchen omeprazole (PRILOSEC) 20 MG capsule   Oral   Take 20 mg by mouth daily.          . ondansetron (ZOFRAN) 8 MG tablet   Oral   Take 1 tablet (8 mg total) by mouth 2 (two) times daily as needed (Nausea or vomiting).   30 tablet   1   . polyethylene glycol powder (GLYCOLAX/MIRALAX) powder   Oral   Take 17 g by mouth daily as needed. Reported on 11/27/2015         . prochlorperazine (COMPAZINE) 10 MG tablet   Oral   Take 1 tablet (10 mg total) by mouth every 6 (six) hours as needed (Nausea or vomiting).   30 tablet   1   . valACYclovir (VALTREX) 500 MG tablet  Oral   Take 500 mg by mouth daily.         . hydrocortisone (ANUSOL-HC) 2.5 % rectal cream   Rectal   Place 1 application rectally 2 (two) times daily.   30 g   2     Allergies Codeine; Lubiprostone; Statins; and Shellfish allergy  Family History  Problem Relation Age of Onset  . Breast cancer Maternal Aunt 65  . Breast cancer Paternal Aunt 20  . Lung cancer Father   . Breast cancer Maternal Aunt   . Heart disease Mother   . Stroke Mother   . Breast cancer Paternal Aunt   . Breast cancer Paternal Aunt   . Colon polyps Sister   . Heart disease Sister   . Rheum arthritis Sister     Social History Social History  Substance Use Topics  . Smoking status: Former Smoker    Quit date: 11/19/1976  . Smokeless tobacco: Never Used  . Alcohol Use: Yes     Comment: rarely    Review of Systems  Constitutional: Positive for fever. Eyes: Negative for visual changes. ENT: Negative for sore throat. Cardiovascular: Negative for chest pain. Respiratory: Negative for shortness of breath. Gastrointestinal: Negative for  abdominal pain, vomiting and diarrhea. Genitourinary: Negative for dysuria. Musculoskeletal: Negative for back pain. Skin: Negative for rash. Neurological: Negative for headaches, focal weakness or numbness.   10-point ROS otherwise negative.  ____________________________________________   PHYSICAL EXAM:  VITAL SIGNS: ED Triage Vitals  Enc Vitals Group     BP 02/19/16 2213 117/57 mmHg     Pulse Rate 02/19/16 2213 124     Resp 02/19/16 2213 20     Temp 02/19/16 2213 99.7 F (37.6 C)     Temp Source 02/19/16 2213 Oral     SpO2 02/19/16 2213 97 %     Weight 02/19/16 2213 150 lb (68.04 kg)     Height 02/19/16 2213 5\' 3"  (1.6 m)     Head Cir --      Peak Flow --      Pain Score 02/19/16 2213 7     Pain Loc --      Pain Edu? --      Excl. in [place]? --      Constitutional: Alert and oriented. Well appearing and in no distress. Eyes: Conjunctivae are normal. PERRL. Normal extraocular movements. ENT   Head: Normocephalic and atraumatic.   Nose: No congestion/rhinnorhea.   Mouth/Throat: Mucous membranes are moist.   Neck: No stridor. Hematological/Lymphatic/Immunilogical: No cervical lymphadenopathy. Cardiovascular: Tachycardia regular rhythm Normal and symmetric distal pulses are present in all extremities. No murmurs, rubs, or gallops. Respiratory: Normal respiratory effort without tachypnea nor retractions. Breath sounds are clear and equal bilaterally. No wheezes/rales/rhonchi. Gastrointestinal: Soft and nontender. No distention. There is no CVA tenderness. Genitourinary: deferred Musculoskeletal: Nontender with normal range of motion in all extremities. No joint effusions.  No lower extremity tenderness nor edema. Neurologic:  Normal speech and language. No gross focal neurologic deficits are appreciated. Speech is normal.  Skin:  Multiple areas of ecchymoses noted on the abdominal wall. Approximate 5 x recently me the area of erythema noted bilateral  flank. Psychiatric: Mood and affect are normal. Speech and behavior are normal. Patient exhibits appropriate insight and judgment.  ____________________________________________    LABS (pertinent positives/negatives)  Labs Reviewed  COMPREHENSIVE METABOLIC PANEL - Abnormal; Notable for the following:    Sodium 133 (*)    Glucose, Bld 178 (*)    Albumin  3.0 (*)    All other components within normal limits  CBC WITH DIFFERENTIAL/PLATELET - Abnormal; Notable for the following:    WBC 0.5 (*)    RBC 2.59 (*)    Hemoglobin 8.1 (*)    HCT 23.1 (*)    RDW 15.4 (*)    Platelets 13 (*)    Neutro Abs 0.0 (*)    Lymphs Abs 0.5 (*)    Monocytes Absolute 0.0 (*)    All other components within normal limits  URINALYSIS COMPLETEWITH MICROSCOPIC ([facility] ONLY) - Abnormal; Notable for the following:    Color, Urine YELLOW (*)    APPearance HAZY (*)    Hgb urine dipstick 2+ (*)    Protein, ur 30 (*)    Squamous Epithelial / LPF 0-5 (*)    All other components within normal limits  TSH - Abnormal; Notable for the following:    TSH 0.281 (*)    All other components within normal limits  CULTURE, BLOOD (ROUTINE X 2)  CULTURE, BLOOD (ROUTINE X 2)  URINE CULTURE  LACTIC ACID, PLASMA  LACTIC ACID, PLASMA  HEMOGLOBIN A1C       RADIOLOGY    DG Chest Port 1 View (Final result) Result time: 02/19/16 23:27:14   Final result by Rad Results In Interface (02/19/16 23:27:14)   Narrative:   CLINICAL DATA: Fever today.  EXAM: PORTABLE CHEST 1 VIEW  COMPARISON: Radiographs 03/04/2012  FINDINGS: The cardiomediastinal contours are normal. The lungs are clear. Pulmonary vasculature is normal. No consolidation, pleural effusion, or pneumothorax. Stable mild biapical pleural parenchymal scarring. No acute osseous abnormalities are seen.  IMPRESSION: No active disease.   Electronically Signed By: Jeb Levering M.D. On: 02/19/2016 23:27      Procedures   Critical Care  performed: CRITICAL CARE Performed by: Gregor Hams   Total critical care time: 30 minutes  Critical care time was exclusive of separately billable procedures and treating other patients.  Critical care was necessary to treat or prevent imminent or life-threatening deterioration.  Critical care was time spent personally by me on the following activities: development of treatment plan with patient and/or surrogate as well as nursing, discussions with consultants, evaluation of patient's response to treatment, examination of patient, obtaining history from patient or surrogate, ordering and performing treatments and interventions, ordering and review of laboratory studies, ordering and review of radiographic studies, pulse oximetry and re-evaluation of patient's condition.   ____________________________________________   INITIAL IMPRESSION / ASSESSMENT AND PLAN / ED COURSE  Pertinent labs & imaging results that were available during my care of the patient were reviewed by me and considered in my medical decision making (see chart for details).  Given concern for occult infection patient was neutropenic fever IV vancomycin and Zosyn given on arrival to the emergency department. Blood cultures and urine culture pending. Patient received 30 ML's per kilogram of normal saline given history physical exam vital signs consistent with sepsis  ____________________________________________   FINAL CLINICAL IMPRESSION(S) / ED DIAGNOSES  Final diagnoses:  Neutropenic fever ([place])  Sepsis    Gregor Hams, MD 02/21/16 2259

## 2016-02-19 NOTE — ED Notes (Signed)
Patient is a chemo therapy for MDS, pt reports was sent by PCP due to home fever of 100.4 today. Pt reports has been having intermittent fever for the past 3 months. Pt denies chest pain or dizziness or lightheadedness.

## 2016-02-19 NOTE — ED Notes (Signed)
Patient reports a WBC of 0, patient placed on neutropenic precautions.

## 2016-02-19 NOTE — Progress Notes (Addendum)
Pharmacy Antibiotic Note  Kathryn Boyd is a 75 y.o. female admitted on 02/19/2016 with sepsis.  Pharmacy has been consulted for Zosyn and vancomycin dosing.  Plan: 1. Zosyn 3.375 gm IV Q8H EI 2. Vancomycin 1 gm IV x 1 in ED followed in 10 hours (stacked dosing) by vancomycin 1 gm IV Q18H, predicted trough 16 mcg/mL. Pharmacy will continue to follow and adjust as needed to maintain trough 15 to 20 mcg/mL.   Vd 40.8 L, Ke 0.052 hr-1, T1/2 13.4 hr  Height: 5\' 3"  (160 cm) Weight: 150 lb (68.04 kg) IBW/kg (Calculated) : 52.4  Temp (24hrs), Avg:99.7 F (37.6 C), Min:99.6 F (37.6 C), Max:99.7 F (37.6 C)   Recent Labs Lab 02/14/16 1300 02/18/16 1310  WBC 0.6* 0.5*  CREATININE 0.64 0.77    Estimated Creatinine Clearance: 57.1 mL/min (by C-G formula based on Cr of 0.77).    Allergies  Allergen Reactions  . Codeine Nausea And Vomiting  . Lubiprostone Other (See Comments)    Abdominal cramps  . Statins Other (See Comments)  . Shellfish Allergy     Thank you for allowing pharmacy to be a part of this patient's care.  Laural Benes, Pharm.D., BCPS Clinical Pharmacist 02/19/2016 11:02 PM  02/20/2016 additional antipseudomonal meropenem added for neutropenic fever source unknown. Meropenem 1 gm IV Q8H. Azad Calame A. [place], [place].D., BCPS, Clinical Pharmacist

## 2016-02-20 DIAGNOSIS — M1990 Unspecified osteoarthritis, unspecified site: Secondary | ICD-10-CM | POA: Diagnosis present

## 2016-02-20 DIAGNOSIS — Z7951 Long term (current) use of inhaled steroids: Secondary | ICD-10-CM | POA: Diagnosis not present

## 2016-02-20 DIAGNOSIS — Z801 Family history of malignant neoplasm of trachea, bronchus and lung: Secondary | ICD-10-CM | POA: Diagnosis not present

## 2016-02-20 DIAGNOSIS — Z8371 Family history of colonic polyps: Secondary | ICD-10-CM | POA: Diagnosis not present

## 2016-02-20 DIAGNOSIS — R531 Weakness: Secondary | ICD-10-CM

## 2016-02-20 DIAGNOSIS — D61818 Other pancytopenia: Secondary | ICD-10-CM | POA: Diagnosis present

## 2016-02-20 DIAGNOSIS — Z803 Family history of malignant neoplasm of breast: Secondary | ICD-10-CM | POA: Diagnosis not present

## 2016-02-20 DIAGNOSIS — Z886 Allergy status to analgesic agent status: Secondary | ICD-10-CM | POA: Diagnosis not present

## 2016-02-20 DIAGNOSIS — Z9221 Personal history of antineoplastic chemotherapy: Secondary | ICD-10-CM | POA: Diagnosis not present

## 2016-02-20 DIAGNOSIS — K219 Gastro-esophageal reflux disease without esophagitis: Secondary | ICD-10-CM | POA: Diagnosis present

## 2016-02-20 DIAGNOSIS — R5081 Fever presenting with conditions classified elsewhere: Secondary | ICD-10-CM

## 2016-02-20 DIAGNOSIS — R000 Tachycardia, unspecified: Secondary | ICD-10-CM | POA: Diagnosis present

## 2016-02-20 DIAGNOSIS — R509 Fever, unspecified: Secondary | ICD-10-CM | POA: Diagnosis not present

## 2016-02-20 DIAGNOSIS — R5383 Other fatigue: Secondary | ICD-10-CM

## 2016-02-20 DIAGNOSIS — E43 Unspecified severe protein-calorie malnutrition: Secondary | ICD-10-CM | POA: Insufficient documentation

## 2016-02-20 DIAGNOSIS — E785 Hyperlipidemia, unspecified: Secondary | ICD-10-CM | POA: Diagnosis present

## 2016-02-20 DIAGNOSIS — D469 Myelodysplastic syndrome, unspecified: Secondary | ICD-10-CM | POA: Diagnosis not present

## 2016-02-20 DIAGNOSIS — Z9851 Tubal ligation status: Secondary | ICD-10-CM | POA: Diagnosis not present

## 2016-02-20 DIAGNOSIS — M8580 Other specified disorders of bone density and structure, unspecified site: Secondary | ICD-10-CM | POA: Diagnosis present

## 2016-02-20 DIAGNOSIS — Z6827 Body mass index (BMI) 27.0-27.9, adult: Secondary | ICD-10-CM | POA: Diagnosis not present

## 2016-02-20 DIAGNOSIS — Z823 Family history of stroke: Secondary | ICD-10-CM | POA: Diagnosis not present

## 2016-02-20 DIAGNOSIS — Z87891 Personal history of nicotine dependence: Secondary | ICD-10-CM | POA: Diagnosis not present

## 2016-02-20 DIAGNOSIS — Z8261 Family history of arthritis: Secondary | ICD-10-CM | POA: Diagnosis not present

## 2016-02-20 DIAGNOSIS — Z9889 Other specified postprocedural states: Secondary | ICD-10-CM | POA: Diagnosis not present

## 2016-02-20 DIAGNOSIS — D709 Neutropenia, unspecified: Secondary | ICD-10-CM | POA: Diagnosis present

## 2016-02-20 DIAGNOSIS — R630 Anorexia: Secondary | ICD-10-CM

## 2016-02-20 DIAGNOSIS — Z888 Allergy status to other drugs, medicaments and biological substances status: Secondary | ICD-10-CM | POA: Diagnosis not present

## 2016-02-20 DIAGNOSIS — Z8249 Family history of ischemic heart disease and other diseases of the circulatory system: Secondary | ICD-10-CM | POA: Diagnosis not present

## 2016-02-20 DIAGNOSIS — Z79899 Other long term (current) drug therapy: Secondary | ICD-10-CM | POA: Diagnosis not present

## 2016-02-20 LAB — CBC WITH DIFFERENTIAL/PLATELET
BLASTS: 0 %
Band Neutrophils: 0 %
Basophils Absolute: 0 10*3/uL (ref 0–0.1)
Basophils Relative: 0 %
EOS PCT: 0 %
Eosinophils Absolute: 0 10*3/uL (ref 0–0.7)
HEMATOCRIT: 23.1 % — AB (ref 35.0–47.0)
Hemoglobin: 8.1 g/dL — ABNORMAL LOW (ref 12.0–16.0)
LYMPHS ABS: 0.5 10*3/uL — AB (ref 1.0–3.6)
Lymphocytes Relative: 92 %
MCH: 31.2 pg (ref 26.0–34.0)
MCHC: 34.9 g/dL (ref 32.0–36.0)
MCV: 89.2 fL (ref 80.0–100.0)
MONOS PCT: 4 %
Metamyelocytes Relative: 4 %
Monocytes Absolute: 0 10*3/uL — ABNORMAL LOW (ref 0.2–0.9)
Myelocytes: 0 %
NEUTROS ABS: 0 10*3/uL — AB (ref 1.4–6.5)
NEUTROS PCT: 0 %
NRBC: 0 /100{WBCs}
Other: 0 %
PLATELETS: 13 10*3/uL — AB (ref 150–440)
Promyelocytes Absolute: 0 %
RBC: 2.59 MIL/uL — AB (ref 3.80–5.20)
RDW: 15.4 % — AB (ref 11.5–14.5)
WBC: 0.5 10*3/uL — AB (ref 3.6–11.0)

## 2016-02-20 LAB — URINALYSIS COMPLETE WITH MICROSCOPIC (ARMC ONLY)
BACTERIA UA: NONE SEEN
Bilirubin Urine: NEGATIVE
Glucose, UA: NEGATIVE mg/dL
KETONES UR: NEGATIVE mg/dL
Leukocytes, UA: NEGATIVE
NITRITE: NEGATIVE
PH: 5 (ref 5.0–8.0)
PROTEIN: 30 mg/dL — AB
Specific Gravity, Urine: 1.021 (ref 1.005–1.030)

## 2016-02-20 LAB — LACTIC ACID, PLASMA: Lactic Acid, Venous: 0.8 mmol/L (ref 0.5–1.9)

## 2016-02-20 LAB — HEMOGLOBIN A1C: Hgb A1c MFr Bld: 7.2 % — ABNORMAL HIGH (ref 4.0–6.0)

## 2016-02-20 LAB — MRSA PCR SCREENING: MRSA BY PCR: NEGATIVE

## 2016-02-20 LAB — TSH: TSH: 0.281 u[IU]/mL — ABNORMAL LOW (ref 0.350–4.500)

## 2016-02-20 MED ORDER — SODIUM CHLORIDE 0.9% FLUSH: 10.0000 mL | INTRAVENOUS | Status: DC | PRN

## 2016-02-20 MED ORDER — DIPHENHYDRAMINE HCL 25 MG PO TABS
25.0000 mg | ORAL_TABLET | Freq: Every evening | ORAL | Status: DC | PRN
  Administered 2016-02-20: 25 mg via ORAL
  Filled 2016-02-20: qty 1

## 2016-02-20 MED ORDER — ONDANSETRON HCL 4 MG PO TABS: 8.0000 mg | ORAL_TABLET | Freq: Two times a day (BID) | ORAL | Status: DC | PRN

## 2016-02-20 MED ORDER — HEPARIN SOD (PORK) LOCK FLUSH 100 UNIT/ML IV SOLN: 500.0000 [IU] | Freq: Every day | INTRAVENOUS | Status: DC | PRN

## 2016-02-20 MED ORDER — POLYETHYLENE GLYCOL 3350 17 G PO PACK: 17.0000 g | PACK | Freq: Every day | ORAL | Status: DC | PRN

## 2016-02-20 MED ORDER — HYDROCODONE-ACETAMINOPHEN 5-325 MG PO TABS: 1.0000 | ORAL_TABLET | ORAL | Status: DC | PRN

## 2016-02-20 MED ORDER — HYDROCORTISONE 2.5 % RE CREA
1.0000 "application " | TOPICAL_CREAM | Freq: Two times a day (BID) | RECTAL | Status: DC
  Filled 2016-02-20: qty 28.35

## 2016-02-20 MED ORDER — SODIUM CHLORIDE 0.9 % IV SOLN
INTRAVENOUS | Status: DC
  Administered 2016-02-20 – 2016-02-21 (×3): via INTRAVENOUS

## 2016-02-20 MED ORDER — ACETAMINOPHEN 325 MG PO TABS: 650.0000 mg | ORAL_TABLET | Freq: Four times a day (QID) | ORAL | Status: DC | PRN

## 2016-02-20 MED ORDER — MEROPENEM 1 G IV SOLR
1.0000 g | Freq: Three times a day (TID) | INTRAVENOUS | Status: DC
  Administered 2016-02-20 – 2016-02-21 (×5): 1 g via INTRAVENOUS
  Filled 2016-02-20 (×8): qty 1

## 2016-02-20 MED ORDER — SODIUM CHLORIDE 0.9% FLUSH
3.0000 mL | Freq: Two times a day (BID) | INTRAVENOUS | Status: DC
  Administered 2016-02-20 – 2016-02-21 (×2): 3 mL via INTRAVENOUS

## 2016-02-20 MED ORDER — VALACYCLOVIR HCL 500 MG PO TABS
500.0000 mg | ORAL_TABLET | Freq: Every day | ORAL | Status: DC
  Administered 2016-02-20 – 2016-02-21 (×2): 500 mg via ORAL
  Filled 2016-02-20 (×2): qty 1

## 2016-02-20 MED ORDER — LORATADINE 10 MG PO TABS: 10.0000 mg | ORAL_TABLET | Freq: Every day | ORAL | Status: DC | PRN

## 2016-02-20 MED ORDER — PANTOPRAZOLE SODIUM 40 MG PO TBEC
40.0000 mg | DELAYED_RELEASE_TABLET | Freq: Every day | ORAL | Status: DC
  Administered 2016-02-20 – 2016-02-21 (×2): 40 mg via ORAL
  Filled 2016-02-20 (×2): qty 1

## 2016-02-20 MED ORDER — FLUCONAZOLE 100 MG PO TABS
200.0000 mg | ORAL_TABLET | Freq: Two times a day (BID) | ORAL | Status: DC
  Administered 2016-02-20 – 2016-02-21 (×3): 200 mg via ORAL
  Filled 2016-02-20 (×3): qty 2

## 2016-02-20 MED ORDER — FLUTICASONE PROPIONATE 50 MCG/ACT NA SUSP
1.0000 | Freq: Every day | NASAL | Status: DC | PRN
  Filled 2016-02-20: qty 16

## 2016-02-20 MED ORDER — LEVOFLOXACIN 500 MG PO TABS: 500.0000 mg | ORAL_TABLET | Freq: Every day | ORAL | Status: DC

## 2016-02-20 MED ORDER — PIPERACILLIN-TAZOBACTAM 3.375 G IVPB
3.3750 g | Freq: Three times a day (TID) | INTRAVENOUS | Status: DC
  Administered 2016-02-20: 06:00:00 3.375 g via INTRAVENOUS
  Filled 2016-02-20 (×3): qty 50

## 2016-02-20 MED ORDER — DIPHENHYDRAMINE HCL 25 MG PO CAPS
ORAL_CAPSULE | ORAL | Status: AC
  Administered 2016-02-20: 25 mg via ORAL
  Filled 2016-02-20: qty 1

## 2016-02-20 MED ORDER — SODIUM CHLORIDE 0.9% FLUSH: 3.0000 mL | INTRAVENOUS | Status: DC | PRN

## 2016-02-20 MED ORDER — DIPHENHYDRAMINE HCL 25 MG PO CAPS
25.0000 mg | ORAL_CAPSULE | Freq: Once | ORAL | Status: AC
  Administered 2016-02-20: 21:00:00 25 mg via ORAL
  Filled 2016-02-20: qty 1

## 2016-02-20 MED ORDER — ACETAMINOPHEN 325 MG PO TABS
650.0000 mg | ORAL_TABLET | Freq: Once | ORAL | Status: AC
  Administered 2016-02-20: 21:00:00 650 mg via ORAL
  Filled 2016-02-20: qty 2

## 2016-02-20 MED ORDER — DOCUSATE SODIUM 100 MG PO CAPS
100.0000 mg | ORAL_CAPSULE | Freq: Two times a day (BID) | ORAL | Status: DC
  Administered 2016-02-20 – 2016-02-21 (×3): 100 mg via ORAL
  Filled 2016-02-20 (×3): qty 1

## 2016-02-20 MED ORDER — SODIUM CHLORIDE 0.9 % IV SOLN
250.0000 mL | Freq: Once | INTRAVENOUS | Status: AC
  Administered 2016-02-20: 250 mL via INTRAVENOUS

## 2016-02-20 MED ORDER — PROCHLORPERAZINE EDISYLATE 5 MG/ML IJ SOLN: 10.0000 mg | Freq: Four times a day (QID) | INTRAMUSCULAR | Status: DC | PRN

## 2016-02-20 MED ORDER — ENSURE ENLIVE PO LIQD
237.0000 mL | Freq: Two times a day (BID) | ORAL | Status: DC
  Administered 2016-02-20 – 2016-02-21 (×3): 237 mL via ORAL

## 2016-02-20 MED ORDER — VANCOMYCIN HCL IN DEXTROSE 1-5 GM/200ML-% IV SOLN
1000.0000 mg | INTRAVENOUS | Status: DC
  Administered 2016-02-20 – 2016-02-21 (×2): 1000 mg via INTRAVENOUS
  Filled 2016-02-20 (×4): qty 200

## 2016-02-20 MED ORDER — HEPARIN SOD (PORK) LOCK FLUSH 100 UNIT/ML IV SOLN: 250.0000 [IU] | INTRAVENOUS | Status: DC | PRN

## 2016-02-20 NOTE — Progress Notes (Signed)
Initial Nutrition Assessment  DOCUMENTATION CODES:   Severe malnutrition in context of acute illness/injury secondary to chemotherapy treatments for cancer  INTERVENTION:   Cater to pt preferences on Regular diet order; will send plastic utensils only Recommend Ensure Enlive po BID, each supplement provides 350 kcal and 20 grams of protein   NUTRITION DIAGNOSIS:   Malnutrition related to acute illness, cancer and cancer related treatments as evidenced by energy intake < or equal to 50% for > or equal to 5 days, percent weight loss.  GOAL:   Patient will meet greater than or equal to 90% of their needs  MONITOR:   PO intake, Supplement acceptance, Labs, Weight trends, I & O's  REASON FOR ASSESSMENT:   Malnutrition Screening Tool    ASSESSMENT:   Pt admitted with febrile neutropenia with h/o myelodysplastic syndrome undergoing chemotherapy.  Past Medical History  Diagnosis Date  . Cancer ([place])     skin ca  . GERD (gastroesophageal reflux disease)   . Hyperlipidemia   . Osteopenia   . 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 PONV (postoperative nausea and vomiting)   . Arthritis   . Anemia   . 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   Pt reports appetite is always poor when she is taking chemotherapy. Pt reports starting her chemotherapy a week ago Monday and has been eating small amounts since. Pt reports tolerating cold items better.  Pt reports only eating a milkshake and some yogurt with granola yesterday, which is about all she can tolerate when on chemotherapy. Pt reports having Ensure in her fridge and had had only one during the week.   Medications: Colace, Diflucan, Protonix, NS at 154mL/hr Labs: Sodium 133   Gastrointestinal Profile: Last BM:   02/20/2016   Nutrition-Focused Physical Exam Findings: Nutrition-Focused physical exam completed. Findings are WDL for fat depletion, muscle depletion, and edema.    Weight Change: 2.5% weight loss in 9 days. Pt reports weight has come down a few pounds, per [facility] weight loss of 4 pounds in the past week on chemotherapy. Pt reports 18-20lbs weight loss in the past 3 months. (12% weight loss in 3 months as well)   Skin:  Reviewed, no issues   Height:   Ht Readings from Last 1 Encounters:  02/20/16 5\' 3"  (1.6 m)    Weight:   Wt Readings from Last 1 Encounters:  02/20/16 153 lb (69.4 kg)   Wt Readings from Last 10 Encounters:  02/20/16 153 lb (69.4 kg)  02/11/16 157 lb 11.8 oz (71.55 kg)  01/28/16 157 lb 8.3 oz (71.45 kg)  01/07/16 160 lb 4.4 oz (72.7 kg)  12/26/15 158 lb (71.668 kg)  12/24/15 162 lb 14.7 oz (73.9 kg)  12/10/15 165 lb 5.5 oz (75 kg)  11/27/15 167 lb 8.8 oz (76 kg)  11/20/15 164 lb (74.39 kg)  11/12/15 168 lb 3.4 oz (76.3 kg)    BMI:  Body mass index is 27.11 kg/(m^2).  Estimated Nutritional Needs:   Kcal:  1725-2050kcals  Protein:  76-90g protein  Fluid:  >/= 1.8L fluid  EDUCATION NEEDS:   No education needs identified at this time  Dwyane Luo, RD, LDN Pager [phone] Weekend/On-Call Pager [phone]

## 2016-02-20 NOTE — Care Management (Signed)
Admitted to [facility] with the diagnosis of febrile neutropenia. Lives with husband, Ernie Hew, [phone]). Last seen Dr. Raechel Ache May 2017. Last seen Dr. Grayland Ormond 02/11/16. No home Health. No skilled facility. No home oxygen. Uses no aids for ambulation. No falls. Decreased appetite since receiving chemotherapy. Self feed, husband helps with dressing and showers. Prescriptions are filled at [facility] in [place]. Husband will transport. Shelbie Ammons RN MSN CCM Care Management [phone]

## 2016-02-20 NOTE — Progress Notes (Signed)
Consent signed for platelet transfusion. Madlyn Frankel, RN

## 2016-02-20 NOTE — Progress Notes (Signed)
[facility] CONSULT NOTE  Patient Care Team: Ezequiel Kayser, MD as PCP - General (Internal Medicine)  CHIEF COMPLAINTS/PURPOSE OF CONSULTATION:  Neutropenic fever  HISTORY OF PRESENTING ILLNESS:  Kathryn Boyd 75 y.o.  female patient follows up with Dr. 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 Patient was found to be neutropenic/anemia Mungal 8.1 and platelets of 13. Patient has been needing outpatient PRBC transfusion.  Patient finished cycle #3 approximately 2 days ago- noted to have fever of 101/chills. Patient had extreme fatigue. Also noted to have intermittent. No unusual shortness of breath or cough. Denies any nausea vomiting diarrhea. Poor appetite. Generalized weakness.   Chest x-ray-unremarkable.; Blood cultures pending.- Patient is on antibiotics and fluids.  ROS: A complete 10 point review of system is done which is negative except mentioned above in history of present illness  MEDICAL HISTORY:  Past Medical History  Diagnosis Date  . Cancer ([place])     skin ca  . GERD (gastroesophageal reflux disease)   . Hyperlipidemia   . Osteopenia   . 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 PONV (postoperative nausea and vomiting)   . Arthritis   . Anemia   . Myeloid dysplasia ([place])     SURGICAL HISTORY: Past Surgical History  Procedure Laterality Date  . Colonoscopy  1999  . Laparoscopic salpingoopherectomy  1968  . Functional endoscopic sinus surgery  2007  . Abdominal surgery  1968    Exploratory. Turned out tubal pregnancy  . Tubal ligation      SOCIAL HISTORY: Social History   Social History  . Marital Status: Married    Spouse Name: N/A  . Number of Children: N/A  . Years of Education: N/A   Occupational History  . Not on file.   Social History Main Topics  . Smoking status: Former Smoker    Quit date: 11/19/1976  .  Smokeless tobacco: Never Used  . Alcohol Use: Yes     Comment: rarely  . Drug Use: No  . Sexual Activity: Not on file   Other Topics Concern  . Not on file   Social History Narrative    FAMILY HISTORY: Family History  Problem Relation Age of Onset  . Breast cancer Maternal Aunt 65  . Breast cancer Paternal Aunt 34  . Lung cancer Father   . Breast cancer Maternal Aunt   . Heart disease Mother   . Stroke Mother   . Breast cancer Paternal Aunt   . Breast cancer Paternal Aunt   . Colon polyps Sister   . Heart disease Sister   . Rheum arthritis Sister     ALLERGIES:  is allergic to codeine; lubiprostone; statins; and shellfish allergy.  MEDICATIONS:  Current Facility-Administered Medications  Medication Dose Route Frequency Provider Last Rate Last Dose  . 0.9 %  sodium chloride infusion   Intravenous Continuous Harrie Foreman, MD 100 mL/hr at 02/20/16 1640    . 0.9 %  sodium chloride infusion  250 mL Intravenous Once Cammie Sickle, MD      . acetaminophen (TYLENOL) tablet 650 mg  650 mg Oral Q6H PRN Harrie Foreman, MD      . acetaminophen (TYLENOL) tablet 650 mg  650 mg Oral Once Cammie Sickle, MD      . diphenhydrAMINE (BENADRYL) capsule 25 mg  25 mg  Oral Once Cammie Sickle, MD      . diphenhydrAMINE (BENADRYL) tablet 25 mg  25 mg Oral QHS PRN Harrie Foreman, MD   25 mg at 02/20/16 0139  . docusate sodium (COLACE) capsule 100 mg  100 mg Oral BID Harrie Foreman, MD   100 mg at 02/20/16 1003  . feeding supplement (ENSURE ENLIVE) (ENSURE ENLIVE) liquid 237 mL  237 mL Oral BID BM Gladstone Lighter, MD   237 mL at 02/20/16 1246  . fluconazole (DIFLUCAN) tablet 200 mg  200 mg Oral BID Harrie Foreman, MD   200 mg at 02/20/16 1003  . fluticasone (FLONASE) 50 MCG/ACT nasal spray 1-2 spray  1-2 spray Each Nare Daily PRN Harrie Foreman, MD      . heparin lock flush 100 unit/mL  500 Units Intracatheter Daily PRN Cammie Sickle, MD      .  heparin lock flush 100 unit/mL  250 Units Intracatheter PRN Cammie Sickle, MD      . HYDROcodone-acetaminophen (NORCO/VICODIN) 5-325 MG per tablet 1-2 tablet  1-2 tablet Oral Q4H PRN Harrie Foreman, MD      . hydrocortisone (ANUSOL-HC) 2.5 % rectal cream 1 application  1 application Rectal BID Harrie Foreman, MD   1 application at [id number] 0230  . loratadine (CLARITIN) tablet 10 mg  10 mg Oral Daily PRN Harrie Foreman, MD      . meropenem [facility]) 1 g in sodium chloride 0.9 % 100 mL IVPB  1 g Intravenous Q8H Harrie Foreman, MD   1 g at 02/20/16 1246  . ondansetron (ZOFRAN) tablet 8 mg  8 mg Oral BID PRN Harrie Foreman, MD      . pantoprazole (PROTONIX) EC tablet 40 mg  40 mg Oral QAC breakfast Harrie Foreman, MD   40 mg at 02/20/16 1003  . polyethylene glycol (MIRALAX / GLYCOLAX) packet 17 g  17 g Oral Daily PRN Harrie Foreman, MD      . prochlorperazine (COMPAZINE) injection 10 mg  10 mg Intravenous Q6H PRN Harrie Foreman, MD      . sodium chloride flush (NS) 0.9 % injection 10 mL  10 mL Intracatheter PRN Cammie Sickle, MD      . sodium chloride flush (NS) 0.9 % injection 3 mL  3 mL Intravenous Q12H Harrie Foreman, MD   3 mL at 02/20/16 0230  . sodium chloride flush (NS) 0.9 % injection 3 mL  3 mL Intracatheter PRN Cammie Sickle, MD      . valACYclovir (VALTREX) tablet 500 mg  500 mg Oral Daily Harrie Foreman, MD   500 mg at 02/20/16 1003  . vancomycin (VANCOCIN) IVPB 1000 mg/200 mL premix  1,000 mg Intravenous Q18H Gregor Hams, MD   1,000 mg at 02/20/16 1003      .  PHYSICAL EXAMINATION:  Filed Vitals:   02/20/16 0442 02/20/16 1253  BP: 102/63 129/62  Pulse: 98 93  Temp: 97.7 F (36.5 C) 98 F (36.7 C)  Resp: 18    Filed Weights   02/19/16 2213 02/20/16 0230  Weight: 150 lb (68.04 kg) 153 lb (69.4 kg)    GENERAL: Well-nourished well-developed; Alert, no distress and comfortable.   Accompanied by her husband.  EYES: no  pallor or icterus OROPHARYNX: no thrush. Patient has whitish spots bilaterally in the posterior tonsillar fossa  NECK: supple, no masses felt LYMPH:  no palpable lymphadenopathy in the  cervical, axillary or inguinal regions LUNGS: decreased breath sounds to auscultation at bases and  No wheeze or crackles HEART/CVS: regular rate & rhythm and no murmurs; No lower extremity edema ABDOMEN: abdomen soft, non-tender and normal bowel sounds Musculoskeletal:no cyanosis of digits and no clubbing  PSYCH: alert & oriented x 3 with fluent speech NEURO: no focal motor/sensory deficits SKIN:  no rashes or significant lesions  LABORATORY DATA:  I have reviewed the data as listed Lab Results  Component Value Date   WBC 0.5* 02/19/2016   HGB 8.1* 02/19/2016   HCT 23.1* 02/19/2016   MCV 89.2 02/19/2016   PLT 13* 02/19/2016    Recent Labs  12/26/15 1220  02/14/16 1300 02/18/16 1310 02/19/16 2300  NA 135  < > 131* 132* 133*  K 3.8  < > 4.0 3.6 3.9  CL 104  < > 102 104 104  CO2 24  < > 24 23 23   GLUCOSE 120*  < > 177* 199* 178*  BUN 11  < > 15 18 16   CREATININE 0.68  < > 0.64 0.77 0.78  CALCIUM 8.8*  < > 8.8* 8.7* 9.0  GFRNONAA >60  < > >60 >60 >60  GFRAA >60  < > >60 >60 >60  PROT 6.9  --   --   --  6.8  ALBUMIN 3.4*  --   --   --  3.0*  AST 17  --   --   --  22  ALT 17  --   --   --  14  ALKPHOS 78  --   --   --  83  BILITOT 0.5  --   --   --  0.4  < > = values in this interval not displayed.  RADIOGRAPHIC STUDIES: I have personally reviewed the radiological images as listed and agreed with the findings in the report. Dg Chest Port 1 View  02/19/2016  CLINICAL DATA:  Fever today. EXAM: PORTABLE CHEST 1 VIEW COMPARISON:  Radiographs 03/04/2012 FINDINGS: The cardiomediastinal contours are normal. The lungs are clear. Pulmonary vasculature is normal. No consolidation, pleural effusion, or pneumothorax. Stable mild biapical pleural parenchymal scarring. No acute osseous abnormalities are  seen. IMPRESSION: No active disease. Electronically Signed   By: Jeb Levering M.D.   On: 02/19/2016 23:27    ASSESSMENT & PLAN:   # 75 year old female patient with a history of high-grade MDS currently on palliative Vidaza status post 3 cycles day  #9 currently admitted to hospital for neutropenic fever.  # Neutropenic fever-unclear source. Agree with broad-spectrum antibiotics and double coverage for pseudomonas. Clinically patient improved. Awaiting blood cultures.  # Pancytopenia- secondary to underlying MDS. Hemoglobin 8.1- awaiting hemoglobin the morning; patient will likely need 2 units of PRBC in the morning. Thrombocytopenia platelet count 13-nosebleed. Will transfuse 1 unit of platelet irradiated tonite. Neutropenia secondary to underlying malignancy. Monitor for now.  # The above plan of care was discussed with the patient/husband in detail. They agree.  Thank you Dr.Kalisetti  for allowing me to participate in the care of your pleasant patient. Please do not hesitate to contact me with questions or concerns in the interim.   All questions were answered. The patient knows to call the clinic with any problems, questions or concerns.    Cammie Sickle, MD 02/20/2016 8:49 PM

## 2016-02-20 NOTE — H&P (Signed)
Kathryn Boyd is an 75 y.o. female.   Chief Complaint: Fever HPI: The patient with past medical history of myelodysplastic syndrome status post chemotherapy presents emergency department upon the request of her oncologist due to fever. The patient admits to feeling weak but denies other complaints. Laboratory evaluation revealed leukopenia. The patient was started on broad-spectrum antibiotics prior to the emergency department staff called the hospitalist service for further management.  Past Medical History  Diagnosis Date  . Cancer ([place])     skin ca  . GERD (gastroesophageal reflux disease)   . Hyperlipidemia   . Osteopenia   . 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 PONV (postoperative nausea and vomiting)   . Arthritis   . Anemia   . Myeloid dysplasia [facility])     Past Surgical History  Procedure Laterality Date  . Colonoscopy  1999  . Laparoscopic salpingoopherectomy  1968  . Functional endoscopic sinus surgery  2007  . Abdominal surgery  1968    Exploratory. Turned out tubal pregnancy  . Tubal ligation      Family History  Problem Relation Age of Onset  . Breast cancer Maternal Aunt 65  . Breast cancer Paternal Aunt 38  . Lung cancer Father   . Breast cancer Maternal Aunt   . Heart disease Mother   . Stroke Mother   . Breast cancer Paternal Aunt   . Breast cancer Paternal Aunt   . Colon polyps Sister   . Heart disease Sister   . Rheum arthritis Sister    Social History:  reports that she quit smoking about 39 years ago. She has never used smokeless tobacco. She reports that she drinks alcohol. She reports that she does not use illicit drugs.  Allergies:  Allergies  Allergen Reactions  . Codeine Nausea And Vomiting  . Lubiprostone Other (See Comments)    Abdominal cramps  . Statins Other (See Comments)  . Shellfish Allergy     Medications Prior to Admission  Medication Sig Dispense Refill  . diphenhydrAMINE (BENADRYL)  25 MG tablet Take 25 mg by mouth at bedtime as needed for sleep.    Marland Kitchen docusate sodium (COLACE) 100 MG capsule Take 100 mg by mouth 2 (two) times daily. Reported on 11/27/2015    . fluconazole (DIFLUCAN) 200 MG tablet Take 200 mg by mouth 2 (two) times daily.     . fluticasone (FLONASE) 50 MCG/ACT nasal spray Place 1-2 sprays into the nose daily as needed for allergies or rhinitis. Reported on 02/11/2016    . levofloxacin (LEVAQUIN) 500 MG tablet Take 500 mg by mouth daily.    Marland Kitchen lidocaine-prilocaine (EMLA) cream Apply 1 application topically as needed. Apply to abdomen 1-2 hours prior to injections. 30 g 5  . loratadine (CLARITIN) 10 MG tablet Take 10 mg by mouth daily as needed for allergies.    Marland Kitchen omeprazole (PRILOSEC) 20 MG capsule Take 20 mg by mouth daily.     . ondansetron (ZOFRAN) 8 MG tablet Take 1 tablet (8 mg total) by mouth 2 (two) times daily as needed (Nausea or vomiting). 30 tablet 1  . polyethylene glycol powder (GLYCOLAX/MIRALAX) powder Take 17 g by mouth daily as needed. Reported on 11/27/2015    . prochlorperazine (COMPAZINE) 10 MG tablet Take 1 tablet (10 mg total) by mouth every 6 (six) hours as needed (Nausea or vomiting). 30 tablet 1  . valACYclovir (VALTREX) 500 MG tablet Take 500 mg by  mouth daily.    . hydrocortisone (ANUSOL-HC) 2.5 % rectal cream Place 1 application rectally 2 (two) times daily. 30 g 2    Results for orders placed or performed during the hospital encounter of 02/19/16 (from the past 48 hour(s))  Comprehensive metabolic panel     Status: Abnormal   Collection Time: 02/19/16 11:00 PM  Result Value Ref Range   Sodium 133 (L) 135 - 145 mmol/L   Potassium 3.9 3.5 - 5.1 mmol/L   Chloride 104 101 - 111 mmol/L   CO2 23 22 - 32 mmol/L   Glucose, Bld 178 (H) 65 - 99 mg/dL   BUN 16 6 - 20 mg/dL   Creatinine, Ser 0.78 0.44 - 1.00 mg/dL   Calcium 9.0 8.9 - 10.3 mg/dL   Total Protein 6.8 6.5 - 8.1 g/dL   Albumin 3.0 (L) 3.5 - 5.0 g/dL   AST 22 15 - 41 U/L   ALT  14 14 - 54 U/L   Alkaline Phosphatase 83 38 - 126 U/L   Total Bilirubin 0.4 0.3 - 1.2 mg/dL   GFR calc non Af Amer >60 >60 mL/min   GFR calc Af Amer >60 >60 mL/min    Comment: (NOTE) The eGFR has been calculated using the CKD EPI equation. This calculation has not been validated in all clinical situations. eGFR's persistently <60 mL/min signify possible Chronic Kidney Disease.    Anion gap 6 5 - 15  CBC WITH DIFFERENTIAL     Status: Abnormal   Collection Time: 02/19/16 11:00 PM  Result Value Ref Range   WBC 0.5 (LL) 3.6 - 11.0 K/uL    Comment: RESULT REPEATED AND VERIFIED CRITICAL RESULT CALLED TO, READ BACK BY AND VERIFIED WITH: SHANNON MARTIN ON 02/18/16 AT 2331 BY TLB    RBC 2.59 (L) 3.80 - 5.20 MIL/uL   Hemoglobin 8.1 (L) 12.0 - 16.0 g/dL   HCT 23.1 (L) 35.0 - 47.0 %   MCV 89.2 80.0 - 100.0 fL   MCH 31.2 26.0 - 34.0 pg   MCHC 34.9 32.0 - 36.0 g/dL   RDW 15.4 (H) 11.5 - 14.5 %   Platelets 13 (LL) 150 - 440 K/uL    Comment: PLATELET COUNT CONFIRMED BY SMEAR CRITICAL RESULT CALLED TO, READ BACK BY AND VERIFIED WITH: SHANNON MARTIN ON 02/19/16 AT 2331 BY TLB    Neutrophils Relative % 0 %   Lymphocytes Relative 92 %   Monocytes Relative 4 %   Eosinophils Relative 0 %   Basophils Relative 0 %   Band Neutrophils 0 %   Metamyelocytes Relative 4 %   Myelocytes 0 %   Promyelocytes Absolute 0 %   Blasts 0 %   nRBC 0 0 /100 WBC   Other 0 %   Neutro Abs 0.0 (L) 1.4 - 6.5 K/uL   Lymphs Abs 0.5 (L) 1.0 - 3.6 K/uL   Monocytes Absolute 0.0 (L) 0.2 - 0.9 K/uL   Eosinophils Absolute 0.0 0 - 0.7 K/uL   Basophils Absolute 0.0 0 - 0.1 K/uL   RBC Morphology MIXED RBC POPULATION     Comment: HYPOCHROMIA  Lactic acid, plasma     Status: None   Collection Time: 02/19/16 11:00 PM  Result Value Ref Range   Lactic Acid, Venous 1.8 0.5 - 1.9 mmol/L  Urinalysis complete, with microscopic ([facility] only)     Status: Abnormal   Collection Time: 02/19/16 11:43 PM  Result Value Ref Range    Color, Urine YELLOW (A) YELLOW  APPearance HAZY (A) CLEAR   Glucose, UA NEGATIVE NEGATIVE mg/dL   Bilirubin Urine NEGATIVE NEGATIVE   Ketones, ur NEGATIVE NEGATIVE mg/dL   Specific Gravity, Urine 1.021 1.005 - 1.030   Hgb urine dipstick 2+ (A) NEGATIVE   pH 5.0 5.0 - 8.0   Protein, ur 30 (A) NEGATIVE mg/dL   Nitrite NEGATIVE NEGATIVE   Leukocytes, UA NEGATIVE NEGATIVE   RBC / HPF 0-5 0 - 5 RBC/hpf   WBC, UA 6-30 0 - 5 WBC/hpf   Bacteria, UA NONE SEEN NONE SEEN   Squamous Epithelial / LPF 0-5 (A) NONE SEEN   Mucous PRESENT    Hyaline Casts, UA PRESENT    Dg Chest Port 1 View  02/19/2016  CLINICAL DATA:  Fever today. EXAM: PORTABLE CHEST 1 VIEW COMPARISON:  Radiographs 03/04/2012 FINDINGS: The cardiomediastinal contours are normal. The lungs are clear. Pulmonary vasculature is normal. No consolidation, pleural effusion, or pneumothorax. Stable mild biapical pleural parenchymal scarring. No acute osseous abnormalities are seen. IMPRESSION: No active disease. Electronically Signed   By: Jeb Levering M.D.   On: 02/19/2016 23:27    Review of Systems  Constitutional: Positive for fever. Negative for chills.  HENT: Negative for sore throat and tinnitus.   Eyes: Negative for blurred vision and redness.  Respiratory: Negative for cough and shortness of breath.   Cardiovascular: Negative for chest pain, palpitations, orthopnea and PND.  Gastrointestinal: Negative for nausea, vomiting, abdominal pain and diarrhea.  Genitourinary: Negative for dysuria, urgency and frequency.  Musculoskeletal: Negative for myalgias and joint pain.  Skin: Negative for rash.       No lesions  Neurological: Positive for weakness. Negative for speech change and focal weakness.  Endo/Heme/Allergies: Does not bruise/bleed easily.       No temperature intolerance  Psychiatric/Behavioral: Negative for depression and suicidal ideas.    Blood pressure 108/55, pulse 97, temperature 99.7 F (37.6 C), temperature  source Oral, resp. rate 18, height 5' 3"  (1.6 m), weight 68.04 kg (150 lb), SpO2 98 %. Physical Exam  Vitals reviewed. Constitutional: She is oriented to person, place, and time. She appears well-developed and well-nourished. No distress.  HENT:  Head: Normocephalic and atraumatic.  Mouth/Throat: Oropharynx is clear and moist.  Eyes: Conjunctivae and EOM are normal. Pupils are equal, round, and reactive to light. No scleral icterus.  Neck: Normal range of motion. Neck supple. No JVD present. No tracheal deviation present. No thyromegaly present.  Cardiovascular: Normal rate, regular rhythm and normal heart sounds.  Exam reveals no gallop and no friction rub.   No murmur heard. Respiratory: Effort normal and breath sounds normal.  GI: Soft. Bowel sounds are normal. She exhibits no distension. There is no tenderness.  Genitourinary:  Deferred  Lymphadenopathy:    She has no cervical adenopathy.  Neurological: She is alert and oriented to person, place, and time. No cranial nerve deficit. She exhibits normal muscle tone.  Skin: Skin is warm and dry. No rash noted. No erythema.  Abdominal bruising  Psychiatric: She has a normal mood and affect. Her behavior is normal. Judgment and thought content normal.     Assessment/Plan This is a 75 year old female admitted for neutropenic fever. 1. Febrile neutropenia: Absolute neutrophil count is 0. Continue vancomycin and Zosyn. I have added meropenem for double pseudomonal coverage. Oncology has been consulted. 2. Sepsis: The patient meets criteria via tachycardia and fever. She is hemodynamically stable. Continue broad-spectrum antibiotics and follow blood cultures for growth and sensitivities 3. Anemia: Symptomatic but  stable. 4. Thrombocytopenia: No anticoagulation at this time due to high risk of bleeding. Defer to oncology regarding possible transfusion of blood products. 5. DVT prophylaxis: SCDs 6. GI prophylaxis: None The patient is a full  code. Time spent on admission orders and patient care approximately 45 minutes  Harrie Foreman, MD 02/20/2016, 1:33 AM

## 2016-02-20 NOTE — Progress Notes (Signed)
[place] [facility] [place] NAME: Kathryn Boyd    MR#:  [id number]  DATE OF BIRTH:  01/06/1941  SUBJECTIVE:  CHIEF COMPLAINT:   Chief Complaint  Patient presents with  . 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 Cultures are pending at this time.  REVIEW OF SYSTEMS:  Review of Systems  Constitutional: Positive for fever and malaise/fatigue. Negative for chills.  HENT: Negative for ear discharge, ear pain and nosebleeds.   Eyes: Negative for blurred vision, double vision and photophobia.  Respiratory: Negative for cough, shortness of breath and wheezing.   Cardiovascular: Negative for chest pain, palpitations and leg swelling.  Gastrointestinal: Negative for nausea, vomiting, abdominal pain, diarrhea and constipation.  Genitourinary: Negative for dysuria and urgency.  Musculoskeletal: Negative for myalgias.  Neurological: Negative for dizziness, sensory change, speech change, focal weakness, seizures, weakness and headaches.  Psychiatric/Behavioral: Negative for depression.    DRUG ALLERGIES:   Allergies  Allergen Reactions  . Codeine Nausea And Vomiting  . Lubiprostone Other (See Comments)    Abdominal cramps  . Statins Other (See Comments)  . Shellfish Allergy     VITALS:  Blood pressure 129/62, pulse 93, temperature 98 F (36.7 C), temperature source Oral, resp. rate 18, height _0  (1.6 m), weight 69.4 kg (153 lb), SpO2 100 %.  PHYSICAL EXAMINATION:  Physical Exam  GENERAL:  75 y.o.-year-old patient lying in the bed with no acute distress.  EYES: Pupils equal, round, reactive to light and accommodation. No scleral icterus. Extraocular muscles intact. Pale conjunctiva  HEENT: Head atraumatic, normocephalic. Oropharynx and nasopharynx clear.   NECK:  Supple, no jugular venous distention. No thyroid enlargement, no tenderness.  LUNGS: Normal breath sounds bilaterally, no wheezing, rales,rhonchi or crepitation. No use of accessory muscles of respiration.  CARDIOVASCULAR: S1, S2 normal. No murmurs, rubs, or gallops.  ABDOMEN: Soft, nontender, nondistended. Bowel sounds present. No organomegaly or mass.  EXTREMITIES: No pedal edema, cyanosis, or clubbing.  NEUROLOGIC: Cranial nerves II through XII are intact. Muscle strength 5/5 in all extremities. Sensation intact. Gait not checked.  PSYCHIATRIC: The patient is alert and oriented x 3.  SKIN: No obvious rash, lesion, or ulcer.    LABORATORY PANEL:   CBC  Recent Labs Lab 02/19/16 2300  WBC 0.5*  HGB 8.1*  HCT 23.1*  PLT 13*   ------------------------------------------------------------------------------------------------------------------  Chemistries   Recent Labs Lab 02/19/16 2300  NA 133*  K 3.9  CL 104  CO2 23  GLUCOSE 178*  BUN 16  CREATININE 0.78  CALCIUM 9.0  AST 22  ALT 14  ALKPHOS 83  BILITOT 0.4   ------------------------------------------------------------------------------------------------------------------  Cardiac Enzymes No results for input(s): TROPONINI in the last 168 hours. ------------------------------------------------------------------------------------------------------------------  RADIOLOGY:  Dg Chest Port 1 View  02/19/2016  CLINICAL DATA:  Fever today. EXAM: PORTABLE CHEST 1 VIEW COMPARISON:  Radiographs 03/04/2012 FINDINGS: The cardiomediastinal contours are normal. The lungs are clear. Pulmonary vasculature is normal. No consolidation, pleural effusion, or pneumothorax. Stable mild biapical pleural parenchymal scarring. No acute osseous abnormalities are seen. IMPRESSION: No active disease. Electronically Signed   By: Jeb Levering M.D.   On: 02/19/2016 23:27    EKG:   Orders placed or performed during the hospital  encounter of 02/19/16  . ED EKG 12-Lead  . ED EKG 12-Lead  ASSESSMENT AND PLAN:   75 year old female with past medical history significant for myelodysplastic syndrome with refractory anemia, GERD,, hyperlipidemia, arthritis presents to hospital secondary to neutropenic fevers.  #1 neutropenic fever-WBC count is 0.5, absolute neutrophil count is 0. The patient has had low WBC from her MDS. -As outpatient she was having low-grade temperatures less than 100F and was on empiric Levaquin, Valtrex and fluconazole. Since her temperature was greater than 100.6F, she was sent for admission. -Patient denies any symptoms at this time. Cultures are pending. -Continue vancomycin and meropenem. Discontinue Zosyn. -If labs are stable, no further fevers and cultures remain negative -she can be discharged.  #2 MDS, with refractory anemia and excess blasts-also being evaluated by [facility]. Has another bone marrow biopsy scheduled at [facility] in 10 days. -currently on Vidaza- cycle 3 -Oncology consulted. Significant leukopenia present.  #3 pancytopenia-with refractory anemia and thrombocytopenia. Likely worsened from recent chemotherapy dose. -No indication for platelet transfusion unless platelets less than 10 K or actively bleeding -Hemoglobin is at 8.1. Patient say she needs transfusion, will check with oncology for the same prior to discharge. She will need irradiated packed RBC if she needs transfusion.  #4 GERD-Protonix  #5 DVT prophylaxis-Ted's and SCDs. No heparin products due to anemia and thrombocytopenia    Encourage ambulation. Patient is steady at baseline. Anticipate discharge tomorrow if improving   All the records are reviewed and case discussed with Care Management/Social Workerr. Management plans discussed with the patient, family and they are in agreement.  CODE STATUS: Full code  TOTAL TIME TAKING CARE OF THIS PATIENT: 37 minutes.   POSSIBLE D/C IN 1-2 DAYS, DEPENDING ON CLINICAL  CONDITION.   Gladstone Lighter M.D on 02/20/2016 at 3:16 PM  Between 7am to 6pm - Pager - [phone]  After 6pm go to [email or web address] - password [email or web address] [facility]  Office  [phone]  CC: Primary care physician; Ezequiel Kayser, MD

## 2016-02-21 DIAGNOSIS — D61818 Other pancytopenia: Secondary | ICD-10-CM

## 2016-02-21 DIAGNOSIS — Z85828 Personal history of other malignant neoplasm of skin: Secondary | ICD-10-CM

## 2016-02-21 DIAGNOSIS — R509 Fever, unspecified: Secondary | ICD-10-CM

## 2016-02-21 DIAGNOSIS — D469 Myelodysplastic syndrome, unspecified: Secondary | ICD-10-CM

## 2016-02-21 DIAGNOSIS — E785 Hyperlipidemia, unspecified: Secondary | ICD-10-CM

## 2016-02-21 DIAGNOSIS — Z87891 Personal history of nicotine dependence: Secondary | ICD-10-CM

## 2016-02-21 DIAGNOSIS — M8580 Other specified disorders of bone density and structure, unspecified site: Secondary | ICD-10-CM

## 2016-02-21 DIAGNOSIS — K219 Gastro-esophageal reflux disease without esophagitis: Secondary | ICD-10-CM

## 2016-02-21 DIAGNOSIS — D649 Anemia, unspecified: Secondary | ICD-10-CM

## 2016-02-21 DIAGNOSIS — Z801 Family history of malignant neoplasm of trachea, bronchus and lung: Secondary | ICD-10-CM

## 2016-02-21 DIAGNOSIS — M129 Arthropathy, unspecified: Secondary | ICD-10-CM

## 2016-02-21 DIAGNOSIS — D709 Neutropenia, unspecified: Principal | ICD-10-CM

## 2016-02-21 DIAGNOSIS — E049 Nontoxic goiter, unspecified: Secondary | ICD-10-CM

## 2016-02-21 DIAGNOSIS — Z803 Family history of malignant neoplasm of breast: Secondary | ICD-10-CM

## 2016-02-21 LAB — BASIC METABOLIC PANEL
Anion gap: 3 — ABNORMAL LOW (ref 5–15)
BUN: 7 mg/dL (ref 6–20)
CALCIUM: 8.4 mg/dL — AB (ref 8.9–10.3)
CHLORIDE: 109 mmol/L (ref 101–111)
CO2: 26 mmol/L (ref 22–32)
CREATININE: 0.53 mg/dL (ref 0.44–1.00)
GFR calc Af Amer: >60 mL/min (ref >60)
GFR calc non Af Amer: >60 mL/min (ref >60)
GLUCOSE: 129 mg/dL — AB (ref 65–99)
Potassium: 3.6 mmol/L (ref 3.5–5.1)
Sodium: 138 mmol/L (ref 135–145)

## 2016-02-21 LAB — CBC
HEMATOCRIT: 21.3 % — AB (ref 35.0–47.0)
HEMOGLOBIN: 7.4 g/dL — AB (ref 12.0–16.0)
MCH: 31.3 pg (ref 26.0–34.0)
MCHC: 34.7 g/dL (ref 32.0–36.0)
MCV: 90 fL (ref 80.0–100.0)
Platelets: 41 10*3/uL — ABNORMAL LOW (ref 150–440)
RBC: 2.37 MIL/uL — ABNORMAL LOW (ref 3.80–5.20)
RDW: 16 % — AB (ref 11.5–14.5)
WBC: 0.7 10*3/uL — CL (ref 3.6–11.0)

## 2016-02-21 LAB — PREPARE PLATELET PHERESIS: Unit division: 0

## 2016-02-21 LAB — PREPARE RBC (CROSSMATCH)

## 2016-02-21 LAB — URINE CULTURE: Culture: >=100000 — AB

## 2016-02-21 MED ORDER — LEVOFLOXACIN 750 MG PO TABS: 750.0000 mg | ORAL_TABLET | Freq: Every day | ORAL | Status: AC

## 2016-02-21 MED ORDER — LEVOFLOXACIN 750 MG PO TABS: 750.0000 mg | ORAL_TABLET | Freq: Every day | ORAL | Status: DC

## 2016-02-21 MED ORDER — AMOXICILLIN-POT CLAVULANATE 500-125 MG PO TABS: 1.0000 | ORAL_TABLET | Freq: Three times a day (TID) | ORAL | Status: AC

## 2016-02-21 MED ORDER — SODIUM CHLORIDE 0.9 % IV SOLN
Freq: Once | INTRAVENOUS | Status: AC
  Administered 2016-02-21: 15:00:00 via INTRAVENOUS

## 2016-02-21 MED ORDER — AMOXICILLIN-POT CLAVULANATE 500-125 MG PO TABS: 1.0000 | ORAL_TABLET | Freq: Three times a day (TID) | ORAL | Status: DC

## 2016-02-21 NOTE — Progress Notes (Signed)
Patient completed blood transfusion, no adverse reaction noted, no complaints of pain. Patient discharged at 2225 to home with husband, IV taken out, discharge form given to pt, verbalized understanding, all questions answered.

## 2016-02-21 NOTE — Discharge Instructions (Signed)
Blood Transfusion, Care After These instructions give you information about caring for yourself after your procedure. Your doctor may also give you more specific instructions. Call your doctor if you have any problems or questions after your procedure.  HOME CARE  Take medicines only as told by your doctor. Ask your doctor if you can take an over-the-counter pain reliever if you have a fever or headache a day or two after your procedure.  Return to your normal activities as told by your doctor. GET HELP IF:   You develop redness or irritation at your IV site.  You have a fever, chills, or a headache that does not go away.  Your pee (urine) is darker than normal.  Your urine turns:  Pink.  Red.  Ow[place]ark.  The white part of your eye turns yellow (jaundice).  You feel weak after doing your normal activities. GET HELP RIGHT AWAY IF:   You have trouble breathing.  You have fever and chills and you also have:  Anxiety.  Chest or back pain.  Flushed or pink skin.  Clammy or sweaty skin.  A fast heartbeat.  A sick feeling in your stomach (nausea).   This information is not intended to replace advice given to you by your health care provider. Make sure you discuss any questions you have with your health care provider.   Document Released: 08/11/2014 Document Reviewed: 08/11/2014 Elsevier Interactive Patient Education [employer]. Neutropenic Fever Neutropenic fever is a type of fever that can develop in someone who has a very low number of a certain kind of white blood cells called neutrophils (neutropenia). These blood cells are important for fighting infections caused by bacteria and fungi. When you have neutropenia, you could be in danger of a severe infection. You may need to start taking antibiotic medicines. CAUSES Neutrophils are made in the spongy tissue inside your bones (bone marrow). Anything that damages your bone marrow or damages neutrophils after they  leave your bone marrow can cause neutropenia. Once you have a dangerously low level of neutrophils, you are at risk for infection and neutropenic fever. Causes of neutropenia may include:  Cancer treatments.  Bone marrow cancer.  Cancer of the white blood cells (leukemia or myeloma).  Severe infection.  Bone marrow failure (aplastic anemia).  Many types of medicines.  Diseases of the body's defense system (autoimmune diseases).  Inherited genes that cause neutropenia.  Vitamin B deficiency.  Spleen enlargement in rheumatoid arthritis (Felty syndrome). SIGNS AND SYMPTOMS Fever is the main symptom of neutropenic fever. Other signs and symptoms may include:  Chills.  Fatigue.  Painful mouth ulcers.  Cough.  Shortness of breath.  Swollen glands (lymph nodes).  Sore throat.  Sinus and ear infections.  Gum disease.  Skin infection.  Burning and frequent urination.  Rectal infections.  Vaginal discharge or itching. DIAGNOSIS  Your health care provider may diagnose neutropenic fever if your neutrophil count is less than 500 neutrophils per microliter of blood and you have a fever of at least 100.44F (38.0C).   Blood tests and other tests that measure neutrophils will be done. These may include:  A complete blood count (CBC) and a differential white blood count (WBC).  Peripheral smear. This test involves checking a blood sample under a microscope.  Other types of tests may also be done, including:  Chest X-rays.  Cultures of blood and body fluids to look for a source of infection. Your health care provider will also determine if your neutropenic  fever is high risk or low risk.  You may have high-risk neutropenic fever if:  Your neutrophil count is less than 100 neutrophils per microliter of blood.  You have also been diagnosed with pneumonia or another serious medical problem.  Your condition requires you to be treated in the hospital.  You may have  low-risk neutropenic fever if:  Your neutrophil count is more than 100 neutrophils per microliter of blood.  Your chest X-ray is normal.  You do not have an active illness or any other problems that require you to be in the hospital. TREATMENT  You may start treatment as soon as you get diagnosed with neutropenic fever, even if your health care provider is still looking for the source of infection.  Treatment for high-risk neutropenic fever is antibiotic medicine given through an IV access tube. This is done in the hospital. You may be given a single antibiotic or a combination of antibiotics.  Low-risk neutropenic fever may be treated at home. You may have to take one or two different oral antibiotics. In some cases, you may need to be treated with IV antibiotics that are given by a home health care provider who visits your home.  If your health care provider finds a specific cause of infection, you may be switched to the antibiotics that work best against those particular bacteria.  If a fungal infection is found, your medicine will be changed to an antifungal medicine.  If the fever goes away in 3-5 days, you may have to take medicine for about 7 days. If the fever is not responding, you may have to take medicine longer.  You may have to stop taking any medicine that could be causing neutropenic fever.  If you have neutropenic fever from cancer treatment drugs (chemotherapy), you may need to take a type of medicine called white blood cell growth factors. This medicine can help prevent fever. HOME CARE INSTRUCTIONS  Only take medicines as directed by your health care provider.  If you are being treated with oral antibiotics at home, you may need to return to your health care provider every day to have your CBC checked. You may have to do this until your fever responds.  Take your antibiotics as directed. Make sure you finish them even if you start to feel better.  Preventing  infection is important when you have neutropenia. Here are some ways to prevent infections:  Avoid sick friends and family members.  Wash your hands often.  Do noteat uncooked or undercooked meats.  Wash all fruits and vegetables.  Do not eat or drink unpasteurized dairy products.  Get regular dental care, and maintain good dental hygiene.  Get a flu shot. Ask your health care provider whether you need any other vaccines.  Wear gloves when gardening.  Follow up with your health care provider as directed. SEEK MEDICAL CARE IF:  You have chills.  You have a fever.  You have signs or symptoms of infection. SEEK IMMEDIATE MEDICAL CARE IF:  You have trouble breathing.  You have chest pain.   This information is not intended to replace advice given to you by your health care provider. Make sure you discuss any questions you have with your health care provider.   Document Released: 07/26/2013 Document Reviewed: 07/26/2013 Elsevier Interactive Patient Education [employer].

## 2016-02-21 NOTE — Discharge Summary (Signed)
[place] at [place] NAME: Kathryn Boyd    MR#:  [id number]  DATE OF BIRTH:  [birth year]  DATE OF ADMISSION:  02/19/2016 ADMITTING PHYSICIAN: Harrie Foreman, MD  DATE OF DISCHARGE: 02/21/2016  PRIMARY CARE PHYSICIAN: THIES, DAVID, MD    ADMISSION DIAGNOSIS:  Neutropenic fever ([place]) [D70.9, R50.81]  DISCHARGE DIAGNOSIS:  Active Problems:   Febrile neutropenia ([place])   Protein-calorie malnutrition, severe   SECONDARY DIAGNOSIS:   Past Medical History  Diagnosis Date  . Cancer ([place])     skin ca  . GERD (gastroesophageal reflux disease)   . Hyperlipidemia   . Osteopenia   . 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 PONV (postoperative nausea and vomiting)   . Arthritis   . Anemia   . Myeloid dysplasia [facility])     HOSPITAL COURSE:  Kathryn Boyd  is a 75 y.o. female admitted 02/19/2016 with chief complaint Fever . Please see H&P performed by Harrie Foreman, MD for further information. Patient presented with the above symptoms. Started on broad antibiotics. Oncology eluated the patient during her stay. Her culture data remained negative, she has been afebrile the remainder of hospitalization. She did require platelet transfusion as well as 2 unit PRBC transfusion.  DISCHARGE CONDITIONS:   stable  CONSULTS OBTAINED:  Treatment Team:  Cammie Sickle, MD  DRUG ALLERGIES:   Allergies  Allergen Reactions  . Codeine Nausea And Vomiting  . Lubiprostone Other (See Comments)    Abdominal cramps  . Statins Other (See Comments)  . Shellfish Allergy     DISCHARGE MEDICATIONS:   Current Discharge Medication List    START taking these medications   Details  amoxicillin-clavulanate (AUGMENTIN) 500-125 MG tablet Take 1 tablet (500 mg total) by mouth 3 (three) times daily. Qty: 21 tablet, Refills: 0      CONTINUE these medications which have CHANGED   Details  levofloxacin  (LEVAQUIN) 750 MG tablet Take 1 tablet (750 mg total) by mouth daily. Qty: 7 tablet, Refills: 0      CONTINUE these medications which have NOT CHANGED   Details  diphenhydrAMINE (BENADRYL) 25 MG tablet Take 25 mg by mouth at bedtime as needed for sleep.    docusate sodium (COLACE) 100 MG capsule Take 100 mg by mouth 2 (two) times daily. Reported on 11/27/2015    fluconazole (DIFLUCAN) 200 MG tablet Take 200 mg by mouth 2 (two) times daily.     fluticasone (FLONASE) 50 MCG/ACT nasal spray Place 1-2 sprays into the nose daily as needed for allergies or rhinitis. Reported on 02/11/2016    lidocaine-prilocaine (EMLA) cream Apply 1 application topically as needed. Apply to abdomen 1-2 hours prior to injections. Qty: 30 g, Refills: 5    loratadine (CLARITIN) 10 MG tablet Take 10 mg by mouth daily as needed for allergies.    omeprazole (PRILOSEC) 20 MG capsule Take 20 mg by mouth daily.     ondansetron (ZOFRAN) 8 MG tablet Take 1 tablet (8 mg total) by mouth 2 (two) times daily as needed (Nausea or vomiting). Qty: 30 tablet, Refills: 1   Associated Diagnoses: Refractory anemia with excess blasts-2 (HCC)    polyethylene glycol powder (GLYCOLAX/MIRALAX) powder Take 17 g by mouth daily as needed. Reported on 11/27/2015    prochlorperazine (COMPAZINE) 10 MG tablet Take 1 tablet (10 mg total) by mouth every 6 (six) hours as needed (Nausea or vomiting).  Qty: 30 tablet, Refills: 1   Associated Diagnoses: Refractory anemia with excess blasts-2 (HCC)    valACYclovir (VALTREX) 500 MG tablet Take 500 mg by mouth daily.    hydrocortisone (ANUSOL-HC) 2.5 % rectal cream Place 1 application rectally 2 (two) times daily. Qty: 30 g, Refills: 2         DISCHARGE INSTRUCTIONS:  Continue antibiotics until count recovery and remains afebrile -- follow up with oncology who can determine if further antibiotics are required  DIET:  Regular diet  DISCHARGE CONDITION:  Stable  ACTIVITY:  Activity as  tolerated  OXYGEN:  Home Oxygen: No.   Oxygen Delivery: room air  DISCHARGE LOCATION:  home   If you experience worsening of your admission symptoms, develop shortness of breath, life threatening emergency, suicidal or homicidal thoughts you must seek medical attention immediately by calling 911 or calling your MD immediately  if symptoms less severe.  You Must read complete instructions/literature along with all the possible adverse reactions/side effects for all the Medicines you take and that have been prescribed to you. Take any new Medicines after you have completely understood and accpet all the possible adverse reactions/side effects.   Please note  You were cared for by a hospitalist during your hospital stay. If you have any questions about your discharge medications or the care you received while you were in the hospital after you are discharged, you can call the unit and asked to speak with the hospitalist on call if the hospitalist that took care of you is not available. Once you are discharged, your primary care physician will handle any further medical issues. Please note that NO REFILLS for any discharge medications will be authorized once you are discharged, as it is imperative that you return to your primary care physician (or establish a relationship with a primary care physician if you do not have one) for your aftercare needs so that they can reassess your need for medications and monitor your lab values.    On the day of Discharge:   VITAL SIGNS:  Blood pressure 129/66, pulse 94, temperature 98.1 F (36.7 C), temperature source Oral, resp. rate 18, height 5\' 3"  (1.6 m), weight 159 lb 8 oz (72.349 kg), SpO2 100 %.  I/O:   Intake/Output Summary (Last 24 hours) at 02/21/16 1244 Last data filed at 02/21/16 0900  Gross per 24 hour  Intake 1578.67 ml  Output      0 ml  Net 1578.67 ml    PHYSICAL EXAMINATION:  GENERAL:  75 y.o.-year-old patient lying in the bed  with no acute distress.  EYES: Pupils equal, round, reactive to light and accommodation. No scleral icterus. Extraocular muscles intact.  HEENT: Head atraumatic, normocephalic. Oropharynx and nasopharynx clear.  NECK:  Supple, no jugular venous distention. No thyroid enlargement, no tenderness.  LUNGS: Normal breath sounds bilaterally, no wheezing, rales,rhonchi or crepitation. No use of accessory muscles of respiration.  CARDIOVASCULAR: S1, S2 normal. No murmurs, rubs, or gallops.  ABDOMEN: Soft, non-tender, non-distended. Bowel sounds present. No organomegaly or mass.  EXTREMITIES: No pedal edema, cyanosis, or clubbing.  NEUROLOGIC: Cranial nerves II through XII are intact. Muscle strength 5/5 in all extremities. Sensation intact. Gait not checked.  PSYCHIATRIC: The patient is alert and oriented x 3.  SKIN: No obvious rash, lesion, or ulcer.   DATA REVIEW:   CBC  Recent Labs Lab 02/21/16 0537  WBC 0.7*  HGB 7.4*  HCT 21.3*  PLT 41*    Chemistries  Recent Labs Lab 02/19/16 2300 02/21/16 0537  NA 133* 138  K 3.9 3.6  CL 104 109  CO2 23 26  GLUCOSE 178* 129*  BUN 16 7  CREATININE 0.78 0.53  CALCIUM 9.0 8.4*  AST 22  --   ALT 14  --   ALKPHOS 83  --   BILITOT 0.4  --     Cardiac Enzymes No results for input(s): TROPONINI in the last 168 hours.  Microbiology Results  Results for orders placed or performed during the hospital encounter of 02/19/16  Blood Culture (routine x 2)     Status: None (Preliminary result)   Collection Time: 02/19/16 11:00 PM  Result Value Ref Range Status   Specimen Description BLOOD RIGHT ARM  Final   Special Requests   Final    BOTTLES DRAWN AEROBIC AND ANAEROBIC  4CCAERO,4CCANA   Culture NO GROWTH 2 DAYS  Final   Report Status PENDING  Incomplete  Blood Culture (routine x 2)     Status: None (Preliminary result)   Collection Time: 02/19/16 11:43 PM  Result Value Ref Range Status   Specimen Description BLOOD LEFT ARM  Final    Special Requests BOTTLES DRAWN AEROBIC AND ANAEROBIC [place]  Final   Culture NO GROWTH 1 DAY  Final   Report Status PENDING  Incomplete  Urine culture     Status: Abnormal   Collection Time: 02/19/16 11:43 PM  Result Value Ref Range Status   Specimen Description URINE, RANDOM  Final   Special Requests NONE  Final   Culture (A)  Final    >=100,000 COLONIES/mL LACTOBACILLUS SPECIES Standardized susceptibility testing for this organism is not available. Performed at [facility]    Report Status 02/21/2016 FINAL  Final  MRSA PCR Screening     Status: None   Collection Time: 02/20/16 10:09 AM  Result Value Ref Range Status   MRSA by PCR NEGATIVE NEGATIVE Final    Comment:        The GeneXpert MRSA Assay (FDA approved for NASAL specimens only), is one component of a comprehensive MRSA colonization surveillance program. It is not intended to diagnose MRSA infection nor to guide or monitor treatment for MRSA infections.     RADIOLOGY:  Dg Chest Port 1 View  02/19/2016  CLINICAL DATA:  Fever today. EXAM: PORTABLE CHEST 1 VIEW COMPARISON:  Radiographs 03/04/2012 FINDINGS: The cardiomediastinal contours are normal. The lungs are clear. Pulmonary vasculature is normal. No consolidation, pleural effusion, or pneumothorax. Stable mild biapical pleural parenchymal scarring. No acute osseous abnormalities are seen. IMPRESSION: No active disease. Electronically Signed   By: Jeb Levering M.D.   On: 02/19/2016 23:27     Management plans discussed with the patient, family and they are in agreement.  CODE STATUS:     Code Status Orders        Start     Ordered   02/20/16 0225  Full code   Continuous     02/20/16 0224    Code Status History    Date Active Date Inactive Code Status Order ID Comments User Context   12/26/2015  3:57 PM 12/27/2015  9:56 PM Full Code [id number]  Vaughan Basta, MD Inpatient   12/26/2015  3:04 PM 12/26/2015  3:57 PM DNR [id number]  Vaughan Basta, MD ED      TOTAL TIME TAKING CARE OF THIS PATIENT: 28 minutes.    Hower,  Karenann Cai.D on 02/21/2016 at 12:44 PM  Between 7am to  6pm - Pager - [phone]  After 6pm go to [email or web address] - Proofreader  [facility]  Office  [phone]  CC: Primary care physician; Ezequiel Kayser, MD

## 2016-02-21 NOTE — Progress Notes (Signed)
[facility] CONSULT NOTE  Patient Care Team: Ezequiel Kayser, MD as PCP - General (Internal Medicine)  CHIEF COMPLAINTS/PURPOSE OF CONSULTATION:  Neutropenic fever  CC: No fevers overnight. No bleeding. No chills. No nausea vomiting or diarrhea.  ROS: Status post transfusion of platelets overnight. No reactions.  MEDICAL HISTORY:  Past Medical History  Diagnosis Date  . Cancer ([place])     skin ca  . GERD (gastroesophageal reflux disease)   . Hyperlipidemia   . Osteopenia   . 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 PONV (postoperative nausea and vomiting)   . Arthritis   . Anemia   . 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 Colonoscopy  1999  . Laparoscopic salpingoopherectomy  1968  . Functional endoscopic sinus surgery  2007  . Abdominal surgery  1968    Exploratory. Turned out tubal pregnancy  . Tubal ligation      SOCIAL HISTORY: Social History   Social History  . Marital Status: Married    Spouse Name: N/A  . Number of Children: N/A  . Years of Education: N/A   Occupational History  . Not on file.   Social History Main Topics  . Smoking status: Former Smoker    Quit date: 11/19/1976  . Smokeless tobacco: Never Used  . Alcohol Use: Yes     Comment: rarely  . Drug Use: No  . Sexual Activity: Not on file   Other Topics Concern  . Not on file   Social History Narrative    FAMILY HISTORY: Family History  Problem Relation Age of Onset  . Breast cancer Maternal Aunt 65  . Breast cancer Paternal Aunt 64  . Lung cancer Father   . Breast cancer Maternal Aunt   . Heart disease Mother   . Stroke Mother   . Breast cancer Paternal Aunt   . Breast cancer Paternal Aunt   . Colon polyps Sister   . Heart disease Sister   . Rheum arthritis Sister     ALLERGIES:  is allergic to codeine; lubiprostone; statins; and shellfish allergy.  MEDICATIONS:   Current Facility-Administered Medications  Medication Dose Route Frequency Provider Last Rate Last Dose  . 0.9 %  sodium chloride infusion   Intravenous Continuous Harrie Foreman, MD 100 mL/hr at 02/21/16 0403    . acetaminophen (TYLENOL) tablet 650 mg  650 mg Oral Q6H PRN Harrie Foreman, MD      . diphenhydrAMINE (BENADRYL) tablet 25 mg  25 mg Oral QHS PRN Harrie Foreman, MD   25 mg at 02/20/16 0139  . docusate sodium (COLACE) capsule 100 mg  100 mg Oral BID Harrie Foreman, MD   100 mg at 02/20/16 2127  . feeding supplement (ENSURE ENLIVE) (ENSURE ENLIVE) liquid 237 mL  237 mL Oral BID BM Gladstone Lighter, MD   237 mL at 02/20/16 1246  . fluconazole (DIFLUCAN) tablet 200 mg  200 mg Oral BID Harrie Foreman, MD   200 mg at 02/20/16 2126  . fluticasone (FLONASE) 50 MCG/ACT nasal spray 1-2 spray  1-2 spray Each Nare Daily PRN Harrie Foreman, MD      . heparin lock flush 100 unit/mL  500 Units Intracatheter Daily PRN Cammie Sickle, MD      . heparin lock flush 100 unit/mL  250 Units Intracatheter PRN Cammie Sickle, MD      .  HYDROcodone-acetaminophen (NORCO/VICODIN) 5-325 MG per tablet 1-2 tablet  1-2 tablet Oral Q4H PRN Harrie Foreman, MD      . hydrocortisone (ANUSOL-HC) 2.5 % rectal cream 1 application  1 application Rectal BID Harrie Foreman, MD   1 application at [id number]  . loratadine (CLARITIN) tablet 10 mg  10 mg Oral Daily PRN Harrie Foreman, MD      . meropenem [facility]) 1 g in sodium chloride 0.9 % 100 mL IVPB  1 g Intravenous Q8H Harrie Foreman, MD   1 g at 02/21/16 0403  . ondansetron (ZOFRAN) tablet 8 mg  8 mg Oral BID PRN Harrie Foreman, MD      . pantoprazole (PROTONIX) EC tablet 40 mg  40 mg Oral QAC breakfast Harrie Foreman, MD   40 mg at 02/21/16 0802  . polyethylene glycol (MIRALAX / GLYCOLAX) packet 17 g  17 g Oral Daily PRN Harrie Foreman, MD      . prochlorperazine (COMPAZINE) injection 10 mg  10 mg Intravenous Q6H PRN  Harrie Foreman, MD      . sodium chloride flush (NS) 0.9 % injection 10 mL  10 mL Intracatheter PRN Cammie Sickle, MD      . sodium chloride flush (NS) 0.9 % injection 3 mL  3 mL Intravenous Q12H Harrie Foreman, MD   3 mL at 02/20/16 2221  . sodium chloride flush (NS) 0.9 % injection 3 mL  3 mL Intracatheter PRN Cammie Sickle, MD      . valACYclovir (VALTREX) tablet 500 mg  500 mg Oral Daily Harrie Foreman, MD   500 mg at 02/20/16 1003  . vancomycin (VANCOCIN) IVPB 1000 mg/200 mL premix  1,000 mg Intravenous Q18H Gregor Hams, MD   1,000 mg at 02/21/16 0507      .  PHYSICAL EXAMINATION:  Filed Vitals:   02/21/16 0011 02/21/16 0427  BP: 112/52 122/61  Pulse:  86  Temp:  97.9 F (36.6 C)  Resp:  16   Filed Weights   02/19/16 2213 02/20/16 0230 02/21/16 0427  Weight: 150 lb (68.04 kg) 153 lb (69.4 kg) 159 lb 8 oz (72.349 kg)    GENERAL: Well-nourished well-developed; Alert, no distress and comfortable.   She is alone. EYES: no pallor or icterus OROPHARYNX: no thrush. Patient has whitish spots bilaterally in the posterior tonsillar fossa  NECK: supple, no masses felt LYMPH:  no palpable lymphadenopathy in the cervical, axillary or inguinal regions LUNGS: decreased breath sounds to auscultation at bases and  No wheeze or crackles HEART/CVS: regular rate & rhythm and no murmurs; No lower extremity edema ABDOMEN: abdomen soft, non-tender and normal bowel sounds Musculoskeletal:no cyanosis of digits and no clubbing  PSYCH: alert & oriented x 3 with fluent speech NEURO: no focal motor/sensory deficits SKIN:  no rashes or significant lesions  LABORATORY DATA:  I have reviewed the data as listed Lab Results  Component Value Date   WBC 0.7* 02/21/2016   HGB 7.4* 02/21/2016   HCT 21.3* 02/21/2016   MCV 90.0 02/21/2016   PLT 41* 02/21/2016    Recent Labs  12/26/15 1220  02/18/16 1310 02/19/16 2300 02/21/16 0537  NA 135  < > 132* 133* 138  K 3.8  <  > 3.6 3.9 3.6  CL 104  < > 104 104 109  CO2 24  < > 23 23 26   GLUCOSE 120*  < > 199* 178* 129*  BUN 11  < >  18 16 7   CREATININE 0.68  < > 0.77 0.78 0.53  CALCIUM 8.8*  < > 8.7* 9.0 8.4*  GFRNONAA >60  < > >60 >60 >60  GFRAA >60  < > >60 >60 >60  PROT 6.9  --   --  6.8  --   ALBUMIN 3.4*  --   --  3.0*  --   AST 17  --   --  22  --   ALT 17  --   --  14  --   ALKPHOS 78  --   --  83  --   BILITOT 0.5  --   --  0.4  --   < > = values in this interval not displayed.  RADIOGRAPHIC STUDIES: I have personally reviewed the radiological images as listed and agreed with the findings in the report. Dg Chest Port 1 View  02/19/2016  CLINICAL DATA:  Fever today. EXAM: PORTABLE CHEST 1 VIEW COMPARISON:  Radiographs 03/04/2012 FINDINGS: The cardiomediastinal contours are normal. The lungs are clear. Pulmonary vasculature is normal. No consolidation, pleural effusion, or pneumothorax. Stable mild biapical pleural parenchymal scarring. No acute osseous abnormalities are seen. IMPRESSION: No active disease. Electronically Signed   By: Jeb Levering M.D.   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  # Neutropenic fever-unclear source. Agree with broad-spectrum antibiotics and double coverage for pseudomonas. Clinically patient improved- no fevers. Awaiting blood cultures.   # Pancytopenia- secondary to underlying MDS. Hemoglobin 7.4  Recommend 2 units PRBC transfusion today.  Thrombocytopenia platelet count 13 s/p transfsuion- 41.  Neutropenia secondary to underlying malignancy. Monitor for now.  # The above plan of care was discussed with the patient/ Discussed with Dr.Hower.      Cammie Sickle, MD 02/21/2016 8:16 AM

## 2016-02-22 LAB — TYPE AND SCREEN
ABO/RH(D): O POS
ANTIBODY SCREEN: NEGATIVE
UNIT DIVISION: 0
Unit division: 0

## 2016-02-24 LAB — CULTURE, BLOOD (ROUTINE X 2): Culture: NO GROWTH

## 2016-02-25 ENCOUNTER — Inpatient Hospital Stay: Payer: Medicare Other

## 2016-02-25 ENCOUNTER — Telehealth: Payer: Self-pay | Admitting: *Deleted

## 2016-02-25 DIAGNOSIS — D4622 Refractory anemia with excess of blasts 2: Secondary | ICD-10-CM | POA: Diagnosis present

## 2016-02-25 DIAGNOSIS — Z85828 Personal history of other malignant neoplasm of skin: Secondary | ICD-10-CM | POA: Diagnosis not present

## 2016-02-25 DIAGNOSIS — D61818 Other pancytopenia: Secondary | ICD-10-CM | POA: Diagnosis not present

## 2016-02-25 DIAGNOSIS — M8580 Other specified disorders of bone density and structure, unspecified site: Secondary | ICD-10-CM | POA: Diagnosis not present

## 2016-02-25 DIAGNOSIS — M129 Arthropathy, unspecified: Secondary | ICD-10-CM | POA: Diagnosis not present

## 2016-02-25 DIAGNOSIS — R5383 Other fatigue: Secondary | ICD-10-CM | POA: Diagnosis not present

## 2016-02-25 DIAGNOSIS — K649 Unspecified hemorrhoids: Secondary | ICD-10-CM | POA: Diagnosis not present

## 2016-02-25 DIAGNOSIS — R531 Weakness: Secondary | ICD-10-CM | POA: Diagnosis not present

## 2016-02-25 DIAGNOSIS — Z5111 Encounter for antineoplastic chemotherapy: Secondary | ICD-10-CM | POA: Diagnosis not present

## 2016-02-25 DIAGNOSIS — Z801 Family history of malignant neoplasm of trachea, bronchus and lung: Secondary | ICD-10-CM | POA: Diagnosis not present

## 2016-02-25 DIAGNOSIS — Z87891 Personal history of nicotine dependence: Secondary | ICD-10-CM | POA: Diagnosis not present

## 2016-02-25 DIAGNOSIS — Z803 Family history of malignant neoplasm of breast: Secondary | ICD-10-CM | POA: Diagnosis not present

## 2016-02-25 DIAGNOSIS — J029 Acute pharyngitis, unspecified: Secondary | ICD-10-CM | POA: Diagnosis not present

## 2016-02-25 DIAGNOSIS — Z79899 Other long term (current) drug therapy: Secondary | ICD-10-CM | POA: Diagnosis not present

## 2016-02-25 DIAGNOSIS — K219 Gastro-esophageal reflux disease without esophagitis: Secondary | ICD-10-CM | POA: Diagnosis not present

## 2016-02-25 DIAGNOSIS — E785 Hyperlipidemia, unspecified: Secondary | ICD-10-CM | POA: Diagnosis not present

## 2016-02-25 LAB — BASIC METABOLIC PANEL
Anion gap: 7 (ref 5–15)
BUN: 14 mg/dL (ref 6–20)
CHLORIDE: 105 mmol/L (ref 101–111)
CO2: 22 mmol/L (ref 22–32)
CREATININE: 0.72 mg/dL (ref 0.44–1.00)
Calcium: 9 mg/dL (ref 8.9–10.3)
GFR calc non Af Amer: >60 mL/min (ref >60)
Glucose, Bld: 135 mg/dL — ABNORMAL HIGH (ref 65–99)
Potassium: 3.6 mmol/L (ref 3.5–5.1)
Sodium: 134 mmol/L — ABNORMAL LOW (ref 135–145)

## 2016-02-25 LAB — CBC WITH DIFFERENTIAL/PLATELET
BASOS PCT: 0 %
Basophils Absolute: 0 10*3/uL (ref 0–0.1)
EOS PCT: 1 %
Eosinophils Absolute: 0 10*3/uL (ref 0–0.7)
HEMATOCRIT: 32.5 % — AB (ref 35.0–47.0)
HEMOGLOBIN: 11.2 g/dL — AB (ref 12.0–16.0)
LYMPHS ABS: 0.4 10*3/uL — AB (ref 1.0–3.6)
Lymphocytes Relative: 76 %
MCH: 30.5 pg (ref 26.0–34.0)
MCHC: 34.5 g/dL (ref 32.0–36.0)
MCV: 88.5 fL (ref 80.0–100.0)
MONOS PCT: 20 %
Monocytes Absolute: 0.1 10*3/uL — ABNORMAL LOW (ref 0.2–0.9)
NEUTROS ABS: 0 10*3/uL — AB (ref 1.4–6.5)
Neutrophils Relative %: 3 %
Platelets: 14 10*3/uL — CL (ref 150–440)
RBC: 3.67 MIL/uL — AB (ref 3.80–5.20)
RDW: 14.6 % — ABNORMAL HIGH (ref 11.5–14.5)
WBC: 0.5 10*3/uL — CL (ref 3.6–11.0)

## 2016-02-25 LAB — SAMPLE TO BLOOD BANK

## 2016-02-25 LAB — CULTURE, BLOOD (ROUTINE X 2): CULTURE: NO GROWTH

## 2016-02-25 NOTE — Telephone Encounter (Signed)
Critical platelets of 14 and WBC 0.5 today.

## 2016-03-01 ENCOUNTER — Emergency Department: Payer: Medicare Other

## 2016-03-01 ENCOUNTER — Inpatient Hospital Stay
Admission: EM | Admit: 2016-03-01 | Discharge: 2016-03-03 | DRG: 808 | Disposition: A | Discharge status: Home / Self Care | Payer: Medicare Other | Attending: Internal Medicine | Admitting: Internal Medicine

## 2016-03-01 DIAGNOSIS — N390 Urinary tract infection, site not specified: Secondary | ICD-10-CM | POA: Diagnosis present

## 2016-03-01 DIAGNOSIS — E785 Hyperlipidemia, unspecified: Secondary | ICD-10-CM | POA: Diagnosis present

## 2016-03-01 DIAGNOSIS — Z8371 Family history of colonic polyps: Secondary | ICD-10-CM | POA: Diagnosis not present

## 2016-03-01 DIAGNOSIS — Z87891 Personal history of nicotine dependence: Secondary | ICD-10-CM | POA: Diagnosis not present

## 2016-03-01 DIAGNOSIS — D62 Acute posthemorrhagic anemia: Secondary | ICD-10-CM | POA: Diagnosis present

## 2016-03-01 DIAGNOSIS — E119 Type 2 diabetes mellitus without complications: Secondary | ICD-10-CM | POA: Diagnosis present

## 2016-03-01 DIAGNOSIS — Z8261 Family history of arthritis: Secondary | ICD-10-CM | POA: Diagnosis not present

## 2016-03-01 DIAGNOSIS — K068 Other specified disorders of gingiva and edentulous alveolar ridge: Secondary | ICD-10-CM | POA: Diagnosis present

## 2016-03-01 DIAGNOSIS — D696 Thrombocytopenia, unspecified: Secondary | ICD-10-CM | POA: Diagnosis present

## 2016-03-01 DIAGNOSIS — R5081 Fever presenting with conditions classified elsewhere: Secondary | ICD-10-CM | POA: Diagnosis present

## 2016-03-01 DIAGNOSIS — Z803 Family history of malignant neoplasm of breast: Secondary | ICD-10-CM

## 2016-03-01 DIAGNOSIS — M8580 Other specified disorders of bone density and structure, unspecified site: Secondary | ICD-10-CM | POA: Diagnosis present

## 2016-03-01 DIAGNOSIS — Z823 Family history of stroke: Secondary | ICD-10-CM | POA: Diagnosis not present

## 2016-03-01 DIAGNOSIS — Z6826 Body mass index (BMI) 26.0-26.9, adult: Secondary | ICD-10-CM | POA: Diagnosis not present

## 2016-03-01 DIAGNOSIS — K219 Gastro-esophageal reflux disease without esophagitis: Secondary | ICD-10-CM | POA: Diagnosis present

## 2016-03-01 DIAGNOSIS — Z801 Family history of malignant neoplasm of trachea, bronchus and lung: Secondary | ICD-10-CM | POA: Diagnosis not present

## 2016-03-01 DIAGNOSIS — D704 Cyclic neutropenia: Principal | ICD-10-CM | POA: Diagnosis present

## 2016-03-01 DIAGNOSIS — Z79899 Other long term (current) drug therapy: Secondary | ICD-10-CM | POA: Diagnosis not present

## 2016-03-01 DIAGNOSIS — Z792 Long term (current) use of antibiotics: Secondary | ICD-10-CM | POA: Diagnosis not present

## 2016-03-01 DIAGNOSIS — D4620 Refractory anemia with excess of blasts, unspecified: Secondary | ICD-10-CM | POA: Diagnosis present

## 2016-03-01 DIAGNOSIS — D4622 Refractory anemia with excess of blasts 2: Secondary | ICD-10-CM | POA: Diagnosis not present

## 2016-03-01 DIAGNOSIS — D649 Anemia, unspecified: Secondary | ICD-10-CM | POA: Diagnosis not present

## 2016-03-01 DIAGNOSIS — Z8249 Family history of ischemic heart disease and other diseases of the circulatory system: Secondary | ICD-10-CM

## 2016-03-01 DIAGNOSIS — E43 Unspecified severe protein-calorie malnutrition: Secondary | ICD-10-CM | POA: Diagnosis present

## 2016-03-01 DIAGNOSIS — D469 Myelodysplastic syndrome, unspecified: Secondary | ICD-10-CM

## 2016-03-01 DIAGNOSIS — D709 Neutropenia, unspecified: Secondary | ICD-10-CM | POA: Diagnosis present

## 2016-03-01 LAB — CBC WITH DIFFERENTIAL/PLATELET
BASOS ABS: 0 10*3/uL (ref 0–0.1)
BLASTS: 0 %
Band Neutrophils: 0 %
Basophils Relative: 0 %
EOS PCT: 0 %
Eosinophils Absolute: 0 10*3/uL (ref 0–0.7)
HEMATOCRIT: 26.9 % — AB (ref 35.0–47.0)
Hemoglobin: 9.5 g/dL — ABNORMAL LOW (ref 12.0–16.0)
LYMPHS ABS: 0.5 10*3/uL — AB (ref 1.0–3.6)
Lymphocytes Relative: 93 %
MCH: 30.8 pg (ref 26.0–34.0)
MCHC: 35.3 g/dL (ref 32.0–36.0)
MCV: 87.3 fL (ref 80.0–100.0)
METAMYELOCYTES PCT: 0 %
MONOS PCT: 5 %
MYELOCYTES: 2 %
Monocytes Absolute: 0 10*3/uL — ABNORMAL LOW (ref 0.2–0.9)
NEUTROS ABS: 0 10*3/uL — AB (ref 1.4–6.5)
NEUTROS PCT: 0 %
NRBC: 0 /100{WBCs}
Other: 0 %
Platelets: 9 10*3/uL — CL (ref 150–440)
Promyelocytes Absolute: 0 %
RBC: 3.08 MIL/uL — AB (ref 3.80–5.20)
RDW: 13.7 % (ref 11.5–14.5)
WBC: 0.5 10*3/uL — AB (ref 3.6–11.0)

## 2016-03-01 LAB — URINALYSIS COMPLETE WITH MICROSCOPIC (ARMC ONLY)
BACTERIA UA: NONE SEEN
Bilirubin Urine: NEGATIVE
Glucose, UA: 50 mg/dL — AB
Ketones, ur: NEGATIVE mg/dL
LEUKOCYTES UA: NEGATIVE
Nitrite: NEGATIVE
PH: 5 (ref 5.0–8.0)
PROTEIN: NEGATIVE mg/dL
Specific Gravity, Urine: 1.027 (ref 1.005–1.030)

## 2016-03-01 LAB — COMPREHENSIVE METABOLIC PANEL
ALT: 21 U/L (ref 14–54)
ANION GAP: 6 (ref 5–15)
AST: 19 U/L (ref 15–41)
Albumin: 3.5 g/dL (ref 3.5–5.0)
Alkaline Phosphatase: 114 U/L (ref 38–126)
BILIRUBIN TOTAL: 0.6 mg/dL (ref 0.3–1.2)
BUN: 17 mg/dL (ref 6–20)
CHLORIDE: 105 mmol/L (ref 101–111)
CO2: 24 mmol/L (ref 22–32)
Calcium: 9.1 mg/dL (ref 8.9–10.3)
Creatinine, Ser: 0.63 mg/dL (ref 0.44–1.00)
Glucose, Bld: 208 mg/dL — ABNORMAL HIGH (ref 65–99)
POTASSIUM: 3.9 mmol/L (ref 3.5–5.1)
Sodium: 135 mmol/L (ref 135–145)
TOTAL PROTEIN: 7.4 g/dL (ref 6.5–8.1)

## 2016-03-01 MED ORDER — PROCHLORPERAZINE MALEATE 10 MG PO TABS: 10.0000 mg | ORAL_TABLET | Freq: Four times a day (QID) | ORAL | Status: DC | PRN

## 2016-03-01 MED ORDER — PIPERACILLIN-TAZOBACTAM 3.375 G IVPB 30 MIN
3.3750 g | Freq: Once | INTRAVENOUS | Status: AC
  Administered 2016-03-01: 3.375 g via INTRAVENOUS
  Filled 2016-03-01: qty 50

## 2016-03-01 MED ORDER — ACETAMINOPHEN 650 MG RE SUPP: 650.0000 mg | Freq: Four times a day (QID) | RECTAL | Status: DC | PRN

## 2016-03-01 MED ORDER — DIPHENHYDRAMINE HCL 25 MG PO CAPS
25.0000 mg | ORAL_CAPSULE | Freq: Every evening | ORAL | Status: DC | PRN
  Administered 2016-03-01 – 2016-03-02 (×2): 25 mg via ORAL
  Filled 2016-03-01 (×4): qty 1

## 2016-03-01 MED ORDER — CHLORHEXIDINE GLUCONATE 0.12 % MT SOLN
15.0000 mL | Freq: Two times a day (BID) | OROMUCOSAL | Status: DC
  Administered 2016-03-02: 15 mL via OROMUCOSAL
  Filled 2016-03-01: qty 15

## 2016-03-01 MED ORDER — PANTOPRAZOLE SODIUM 40 MG PO TBEC
40.0000 mg | DELAYED_RELEASE_TABLET | Freq: Every day | ORAL | Status: DC
  Administered 2016-03-02: 08:00:00 40 mg via ORAL
  Filled 2016-03-01: qty 1

## 2016-03-01 MED ORDER — SODIUM CHLORIDE 0.9 % IV SOLN
INTRAVENOUS | Status: DC
Start: 2016-03-01 — End: 2016-03-03
  Administered 2016-03-01 – 2016-03-03 (×3): via INTRAVENOUS

## 2016-03-01 MED ORDER — VANCOMYCIN HCL IN DEXTROSE 1-5 GM/200ML-% IV SOLN
1000.0000 mg | Freq: Once | INTRAVENOUS | Status: AC
  Administered 2016-03-01: 1000 mg via INTRAVENOUS
  Filled 2016-03-01: qty 200

## 2016-03-01 MED ORDER — LORATADINE 10 MG PO TABS: 10.0000 mg | ORAL_TABLET | Freq: Every day | ORAL | Status: DC | PRN

## 2016-03-01 MED ORDER — VALACYCLOVIR HCL 500 MG PO TABS
500.0000 mg | ORAL_TABLET | Freq: Every day | ORAL | Status: DC
  Administered 2016-03-02: 08:00:00 500 mg via ORAL
  Filled 2016-03-01: qty 1

## 2016-03-01 MED ORDER — ONDANSETRON HCL 4 MG/2ML IJ SOLN: 4.0000 mg | Freq: Four times a day (QID) | INTRAMUSCULAR | Status: DC | PRN

## 2016-03-01 MED ORDER — ONDANSETRON HCL 4 MG PO TABS: 8.0000 mg | ORAL_TABLET | Freq: Two times a day (BID) | ORAL | Status: DC | PRN

## 2016-03-01 MED ORDER — ACETAMINOPHEN 325 MG PO TABS
650.0000 mg | ORAL_TABLET | Freq: Four times a day (QID) | ORAL | Status: DC | PRN
  Administered 2016-03-02: 650 mg via ORAL
  Filled 2016-03-01: qty 2

## 2016-03-01 MED ORDER — FLUTICASONE PROPIONATE 50 MCG/ACT NA SUSP
2.0000 | Freq: Every day | NASAL | Status: DC | PRN
  Filled 2016-03-01: qty 16

## 2016-03-01 MED ORDER — SODIUM CHLORIDE 0.9 % IV SOLN
1.0000 g | Freq: Once | INTRAVENOUS | Status: AC
  Administered 2016-03-01: 1 g via INTRAVENOUS
  Filled 2016-03-01: qty 1

## 2016-03-01 MED ORDER — CETYLPYRIDINIUM CHLORIDE 0.05 % MT LIQD: 7.0000 mL | Freq: Two times a day (BID) | OROMUCOSAL | Status: DC

## 2016-03-01 MED ORDER — ONDANSETRON HCL 4 MG PO TABS: 4.0000 mg | ORAL_TABLET | Freq: Four times a day (QID) | ORAL | Status: DC | PRN

## 2016-03-01 MED ORDER — SODIUM CHLORIDE 0.9 % IV SOLN
1.0000 g | Freq: Three times a day (TID) | INTRAVENOUS | Status: DC
  Administered 2016-03-02 – 2016-03-03 (×4): 1 g via INTRAVENOUS
  Filled 2016-03-01 (×7): qty 1

## 2016-03-01 MED ORDER — SODIUM CHLORIDE 0.9 % IV SOLN
Freq: Once | INTRAVENOUS | Status: AC
  Administered 2016-03-01: 20:00:00 via INTRAVENOUS

## 2016-03-01 NOTE — ED Notes (Signed)
Obtained consent for transfusion. Consent form on pts chart.

## 2016-03-01 NOTE — ED Notes (Signed)
Pt eating meal

## 2016-03-01 NOTE — Progress Notes (Signed)
Pharmacy Antibiotic Note  Kathryn Boyd is a 75 y.o. female admitted on 03/01/2016 with febrile neutropenia.  Pharmacy has been consulted for meropenem dosing.  Plan: Meropenem 1 g IV q8h    Temp (24hrs), Avg:98.8 F (37.1 C), Min:98.7 F (37.1 C), Max:98.8 F (37.1 C)   Recent Labs Lab 02/25/16 0915 03/01/16 1400  WBC 0.5* 0.5*  CREATININE 0.72 0.63    Estimated Creatinine Clearance: 58.8 mL/min (by C-G formula based on SCr of 0.8 mg/dL).    Allergies  Allergen Reactions  . Codeine Nausea And Vomiting  . Lubiprostone Other (See Comments)    Abdominal cramps  . Statins Other (See Comments)  . Shellfish Allergy    Antimicrobials this admission: meropenem 7/29 >>  Vancomycin and piperacillin/tazobactam 7/29 doses in ED  Dose adjustments this admission:  Microbiology results: 7/29 BCx: Sent 7/29 UCx: Sent   Thank you for allowing pharmacy to be a part of this patient's care.  Lenis Noon, PharmD Clinical Pharmacist 03/01/2016 6:08 PM

## 2016-03-01 NOTE — ED Notes (Signed)
Attempted to call report x 1  

## 2016-03-01 NOTE — H&P (Signed)
[facility] [place] NAME: Kathryn Boyd    MR#:  [id number]  DATE OF BIRTH:  04/04/1941  DATE OF ADMISSION:  03/01/2016  PRIMARY CARE PHYSICIAN: Ezequiel Kayser, MD   REQUESTING/REFERRING PHYSICIAN: Dr. Lisa Roca M.D.  CHIEF COMPLAINT:   Chief Complaint  Patient presents with  . Other    bleeding gums  . Fever    HISTORY OF PRESENT ILLNESS: Kathryn Boyd  is a 75 y.o. female with a known history of Myelodysplastic syndrome who was recently admitted on July 19 with similar type presentation presents to the ED with having fever at home. She was recently admitted with febrile neutropenia with no source identified. She was discharged home. She reports that she was feeling better after discharge for 2 days and then started to feel weak and tired. And then today started having fevers. She called her oncologist who recommended she go to the emergency room. She is again noted to have thrombocytopenia which is worse today. As well as neutropenia. Chest x-ray was negative urinalysis was negative. She did have an episode of diarrhea last night but other than that denies any abdominal pain. No neck stiffness no headaches. No urinary burning hesitancy or urgency no cough.   PAST MEDICAL HISTORY:   Past Medical History:  Diagnosis Date  . Anemia   . Arthritis   . Cancer ([place])    skin ca  . GERD (gastroesophageal reflux disease)   . 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 Hyperlipidemia   . 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 Osteopenia   . PONV (postoperative nausea and vomiting)     PAST SURGICAL HISTORY: Past Surgical History:  Procedure Laterality Date  . ABDOMINAL SURGERY  1968   Exploratory. Turned out tubal pregnancy  . COLONOSCOPY  1999  . FUNCTIONAL ENDOSCOPIC SINUS SURGERY  2007  . LAPAROSCOPIC SALPINGOOPHERECTOMY  1968  . TUBAL LIGATION      SOCIAL HISTORY:  Social History  Substance Use  Topics  . Smoking status: Former Smoker    Quit date: 11/19/1976  . Smokeless tobacco: Never Used  . Alcohol use Yes     Comment: rarely    FAMILY HISTORY:  Family History  Problem Relation Age of Onset  . Breast cancer Maternal Aunt 65  . Breast cancer Paternal Aunt 62  . Lung cancer Father   . Breast cancer Maternal Aunt   . Heart disease Mother   . Stroke Mother   . Breast cancer Paternal Aunt   . Breast cancer Paternal Aunt   . Colon polyps Sister   . Heart disease Sister   . Rheum arthritis Sister     DRUG ALLERGIES:  Allergies  Allergen Reactions  . Codeine Nausea And Vomiting  . Lubiprostone Other (See Comments)    Abdominal cramps  . Statins Other (See Comments)  . Shellfish Allergy     REVIEW OF SYSTEMS:   CONSTITUTIONAL:Positive fever, positive fatigue or positive weakness.  EYES: No blurred or double vision.  EARS, NOSE, AND THROAT: No tinnitus or ear pain.  RESPIRATORY: No cough, shortness of breath, wheezing or hemoptysis.  CARDIOVASCULAR: No chest pain, orthopnea, edema.  GASTROINTESTINAL: No nausea, vomiting, diarrhea or abdominal pain.  GENITOURINARY: No dysuria, hematuria.  ENDOCRINE: No polyuria, nocturia,  HEMATOLOGY: No anemia, easy bruising or bleeding SKIN: No rash or lesion. MUSCULOSKELETAL: No joint pain or arthritis.   NEUROLOGIC: No tingling, numbness,  weakness.  PSYCHIATRY: No anxiety or depression.   MEDICATIONS AT HOME:  Prior to Admission medications   Medication Sig Start Date End Date Taking? Authorizing Provider  acetaminophen (TYLENOL) 325 MG tablet Take 650 mg by mouth.   Yes Historical Provider, MD  docusate sodium (COLACE) 100 MG capsule Take 100 mg by mouth 2 (two) times daily. Reported on 11/27/2015   Yes Historical Provider, MD  fluconazole (DIFLUCAN) 200 MG tablet Take 200 mg by mouth 2 (two) times daily.    Yes Historical Provider, MD  levofloxacin (LEVAQUIN) 750 MG tablet Take 1 tablet (750 mg total) by mouth daily.  02/21/16  Yes Lytle Butte, MD  loratadine (CLARITIN) 10 MG tablet Take 10 mg by mouth daily as needed for allergies.   Yes Historical Provider, MD  omeprazole (PRILOSEC) 20 MG capsule Take 20 mg by mouth daily.    Yes Historical Provider, MD  polyethylene glycol powder (GLYCOLAX/MIRALAX) powder Take 17 g by mouth daily as needed. Reported on 11/27/2015   Yes Historical Provider, MD  valACYclovir (VALTREX) 500 MG tablet Take 500 mg by mouth daily.   Yes Historical Provider, MD  amoxicillin-clavulanate (AUGMENTIN) 500-125 MG tablet Take 1 tablet (500 mg total) by mouth 3 (three) times daily. Patient not taking: Reported on 03/01/2016 02/21/16   Lytle Butte, MD  diphenhydrAMINE (BENADRYL) 25 MG tablet Take 25 mg by mouth at bedtime as needed for sleep.    Historical Provider, MD  fluticasone (FLONASE) 50 MCG/ACT nasal spray Place 1-2 sprays into the nose daily as needed for allergies or rhinitis. Reported on 02/11/2016 06/14/15   Historical Provider, MD  hydrocortisone (ANUSOL-HC) 2.5 % rectal cream Place 1 application rectally 2 (two) times daily. 12/20/15   Lloyd Huger, MD  lidocaine-prilocaine (EMLA) cream Apply 1 application topically as needed. Apply to abdomen 1-2 hours prior to injections. 01/11/16   Lloyd Huger, MD  ondansetron (ZOFRAN) 8 MG tablet Take 1 tablet (8 mg total) by mouth 2 (two) times daily as needed (Nausea or vomiting). 12/02/15   Lloyd Huger, MD  prochlorperazine (COMPAZINE) 10 MG tablet Take 1 tablet (10 mg total) by mouth every 6 (six) hours as needed (Nausea or vomiting). Patient not taking: Reported on 03/01/2016 12/02/15   Lloyd Huger, MD      PHYSICAL EXAMINATION:   VITAL SIGNS: Blood pressure 113/68, pulse 96, temperature 98.8 F (37.1 C), temperature source Oral, resp. rate 16, SpO2 100 %.  GENERAL:  75 y.o.-year-old patient lying in the bed with no acute distress.  EYES: Pupils equal, round, reactive to light and accommodation. No scleral  icterus. Extraocular muscles intact.  HEENT: Head atraumatic, normocephalic. Oropharynx and nasopharynx clear.  NECK:  Supple, no jugular venous distention. No thyroid enlargement, no tenderness.  LUNGS: Normal breath sounds bilaterally, no wheezing, rales,rhonchi or crepitation. No use of accessory muscles of respiration.  CARDIOVASCULAR: S1, S2 normal. No murmurs, rubs, or gallops.  ABDOMEN: Soft, nontender, nondistended. Bowel sounds present. No organomegaly or mass.  EXTREMITIES: No pedal edema, cyanosis, or clubbing.  NEUROLOGIC: Cranial nerves II through XII are intact. Muscle strength 5/5 in all extremities. Sensation intact. Gait not checked.  PSYCHIATRIC: The patient is alert and oriented x 3.  SKIN: She does have some petechiae on her skin no other bruising  LABORATORY PANEL:   CBC  Recent Labs Lab 02/25/16 0915 03/01/16 1400  WBC 0.5* 0.5*  HGB 11.2* 9.5*  HCT 32.5* 26.9*  PLT 14* 9*  MCV 88.5  87.3  MCH 30.5 30.8  MCHC 34.5 35.3  RDW 14.6* 13.7  LYMPHSABS 0.4* 0.5*  MONOABS 0.1* 0.0*  EOSABS 0.0 0.0  BASOSABS 0.0 0.0   ------------------------------------------------------------------------------------------------------------------  Chemistries   Recent Labs Lab 02/25/16 0915 03/01/16 1400  NA 134* 135  K 3.6 3.9  CL 105 105  CO2 22 24  GLUCOSE 135* 208*  BUN 14 17  CREATININE 0.72 0.63  CALCIUM 9.0 9.1  AST  --  19  ALT  --  21  ALKPHOS  --  114  BILITOT  --  0.6   ------------------------------------------------------------------------------------------------------------------ estimated creatinine clearance is 58.8 mL/min (by C-G formula based on SCr of 0.8 mg/dL). ------------------------------------------------------------------------------------------------------------------ No results for input(s): TSH, T4TOTAL, T3FREE, THYROIDAB in the last 72 hours.  Invalid input(s): FREET3   Coagulation profile No results for input(s): INR, PROTIME in  the last 168 hours. ------------------------------------------------------------------------------------------------------------------- No results for input(s): DDIMER in the last 72 hours. -------------------------------------------------------------------------------------------------------------------  Cardiac Enzymes No results for input(s): CKMB, TROPONINI, MYOGLOBIN in the last 168 hours.  Invalid input(s): CK ------------------------------------------------------------------------------------------------------------------ Invalid input(s): POCBNP  ---------------------------------------------------------------------------------------------------------------  Urinalysis    Component Value Date/Time   COLORURINE YELLOW (A) 03/01/2016 1503   APPEARANCEUR HAZY (A) 03/01/2016 1503   LABSPEC 1.027 03/01/2016 1503   PHURINE 5.0 03/01/2016 1503   GLUCOSEU 50 (A) 03/01/2016 1503   HGBUR 2+ (A) 03/01/2016 1503   BILIRUBINUR NEGATIVE 03/01/2016 1503   KETONESUR NEGATIVE 03/01/2016 1503   PROTEINUR NEGATIVE 03/01/2016 1503   NITRITE NEGATIVE 03/01/2016 1503   LEUKOCYTESUR NEGATIVE 03/01/2016 1503     RADIOLOGY: Dg Chest Port 1 View  Result Date: 03/01/2016 CLINICAL DATA:  Neutropenia and fever EXAM: PORTABLE CHEST 1 VIEW COMPARISON:  02/19/2016 FINDINGS: The heart size and mediastinal contours are within normal limits. Both lungs are clear. The visualized skeletal structures are unremarkable. IMPRESSION: No active disease. Electronically Signed   By: Inez Catalina M.D.   On: 03/01/2016 15:43   EKG: Orders placed or performed during the hospital encounter of 02/19/16  . ED EKG 12-Lead  . ED EKG 12-Lead    IMPRESSION AND PLAN: Patient is a 75 year old recently discharged for neutropenic fever presents back to the ED with same  1. Febrile neutropenia We will place patient under neutropenia precautions Empiric antibiotics with vancomycin, Zosyn and meropenem Follow-up blood  cultures  2. Myelodysplastic syndrome with worsening thrombocytopenia the ED physician has discussed the case with the oncologists they recommend transfusing platelets  3. GERD continue PPI  4. She is not allergies continue nasal spray and H2 blockers  5. Miscellaneous SCDs for DVT prophylaxis in light of severe thrombocytopenia  All the records are reviewed and case discussed with ED provider. Management plans discussed with the patient, family and they are in agreement.  CODE STATUS:    Code Status Orders        Start     Ordered   03/01/16 1648  Full code  Continuous     03/01/16 1649    Code Status History    Date Active Date Inactive Code Status Order ID Comments User Context   02/20/2016  2:24 AM 02/22/2016  2:16 AM Full Code [id number]  Harrie Foreman, MD Inpatient   12/26/2015  3:57 PM 12/27/2015  9:56 PM Full Code [id number]  Vaughan Basta, MD Inpatient   12/26/2015  3:04 PM 12/26/2015  3:57 PM DNR [id number]  Vaughan Basta, MD ED       TOTAL TIME TAKING CARE OF THIS PATIENT: 71minutes.  Dustin Flock M.D on 03/01/2016 at 4:54 PM  Between 7am to 6pm - Pager - [phone]  After 6pm go to [email or web address] - password [email or web address] [facility]  Office  [phone]  CC: Primary care physician; Ezequiel Kayser, MD

## 2016-03-01 NOTE — ED Provider Notes (Signed)
[facility] Emergency Department Provider Note ____________________________________________  Time seen: I have reviewed the triage vital signs and the triage nursing note.  HISTORY  Chief Complaint Other (bleeding gums) and Fever   Historian Patient and spouse  HPI Kathryn Boyd is a 75 y.o. female with a history of myeloid dysplasia, currently receiving chemotherapy treatment, and history of recent febrile neutropenic episodes including last week for which she was admitted and no source was found and then patient was discharged on Augmentin and is also apparentlycompleting Levquin and is chronically on Valtrex and Diflucan.  This patient states that she has had issues with bleeding gums and yesterday started having bleeding gums and she was told to have her platelets checked if this continued and so when she took her temperature today and was 100.6 after taking Tylenol.  She has had platelet transfusions and prior prbc transfusions, goal 8.0.  No coughing, no abdominal pain.  Mild dysuria.     Past Medical History:  Diagnosis Date  . Anemia   . Arthritis   . Cancer ([place])    skin ca  . GERD (gastroesophageal reflux disease)   . 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 Hyperlipidemia   . Myeloid dysplasia ([place])   . Osteopenia   . PONV (postoperative nausea and vomiting)     Patient Active Problem List   Diagnosis Date Noted  . Febrile neutropenia ([place]) 02/20/2016  . Protein-calorie malnutrition, severe 02/20/2016  . UTI (lower urinary tract infection) 12/26/2015  . 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 ABDOMINAL SURGERY  1968   Exploratory. Turned out tubal pregnancy  . COLONOSCOPY  1999  . FUNCTIONAL ENDOSCOPIC SINUS SURGERY  2007  . LAPAROSCOPIC SALPINGOOPHERECTOMY  1968  . TUBAL LIGATION      Prior to Admission medications    Medication Sig Start Date End Date Taking? Authorizing Provider  acetaminophen (TYLENOL) 325 MG tablet Take 650 mg by mouth.   Yes Historical Provider, MD  docusate sodium (COLACE) 100 MG capsule Take 100 mg by mouth 2 (two) times daily. Reported on 11/27/2015   Yes Historical Provider, MD  fluconazole (DIFLUCAN) 200 MG tablet Take 200 mg by mouth 2 (two) times daily.    Yes Historical Provider, MD  levofloxacin (LEVAQUIN) 750 MG tablet Take 1 tablet (750 mg total) by mouth daily. 02/21/16  Yes Lytle Butte, MD  loratadine (CLARITIN) 10 MG tablet Take 10 mg by mouth daily as needed for allergies.   Yes Historical Provider, MD  omeprazole (PRILOSEC) 20 MG capsule Take 20 mg by mouth daily.    Yes Historical Provider, MD  polyethylene glycol powder (GLYCOLAX/MIRALAX) powder Take 17 g by mouth daily as needed. Reported on 11/27/2015   Yes Historical Provider, MD  valACYclovir (VALTREX) 500 MG tablet Take 500 mg by mouth daily.   Yes Historical Provider, MD  amoxicillin-clavulanate (AUGMENTIN) 500-125 MG tablet Take 1 tablet (500 mg total) by mouth 3 (three) times daily. Patient not taking: Reported on 03/01/2016 02/21/16   Lytle Butte, MD  diphenhydrAMINE (BENADRYL) 25 MG tablet Take 25 mg by mouth at bedtime as needed for sleep.    Historical Provider, MD  fluticasone (FLONASE) 50 MCG/ACT nasal spray Place 1-2 sprays into the nose daily as needed for allergies or rhinitis. Reported on 02/11/2016 06/14/15   Historical Provider, MD  hydrocortisone (ANUSOL-HC) 2.5 %  rectal cream Place 1 application rectally 2 (two) times daily. 12/20/15   Lloyd Huger, MD  lidocaine-prilocaine (EMLA) cream Apply 1 application topically as needed. Apply to abdomen 1-2 hours prior to injections. 01/11/16   Lloyd Huger, MD  ondansetron (ZOFRAN) 8 MG tablet Take 1 tablet (8 mg total) by mouth 2 (two) times daily as needed (Nausea or vomiting). 12/02/15   Lloyd Huger, MD  prochlorperazine (COMPAZINE) 10 MG  tablet Take 1 tablet (10 mg total) by mouth every 6 (six) hours as needed (Nausea or vomiting). Patient not taking: Reported on 03/01/2016 12/02/15   Lloyd Huger, MD    Allergies  Allergen Reactions  . Codeine Nausea And Vomiting  . Lubiprostone Other (See Comments)    Abdominal cramps  . Statins Other (See Comments)  . Shellfish Allergy     Family History  Problem Relation Age of Onset  . Breast cancer Maternal Aunt 65  . Breast cancer Paternal Aunt 41  . Lung cancer Father   . Breast cancer Maternal Aunt   . Heart disease Mother   . Stroke Mother   . Breast cancer Paternal Aunt   . Breast cancer Paternal Aunt   . Colon polyps Sister   . Heart disease Sister   . Rheum arthritis Sister     Social History Social History  Substance Use Topics  . Smoking status: Former Smoker    Quit date: 11/19/1976  . Smokeless tobacco: Never Used  . Alcohol use Yes     Comment: rarely    Review of Systems  Constitutional: Positive for fever. Eyes: Negative for visual changes. ENT: Negative for sore throat. Cardiovascular: Negative for chest pain. Respiratory: Negative for shortness of breath. Gastrointestinal: Negative for abdominal pain, vomiting and diarrhea. Genitourinary: Mild dysuria. Musculoskeletal: Negative for back pain. Skin: Negative for rash. Neurological: Negative for headache. 10 point Review of Systems otherwise negative ____________________________________________   PHYSICAL EXAM:  VITAL SIGNS: ED Triage Vitals  Enc Vitals Group     BP 03/01/16 1411 125/71     Pulse Rate 03/01/16 1411 (!) 111     Resp 03/01/16 1411 14     Temp 03/01/16 1435 98.8 F (37.1 C)     Temp Source 03/01/16 1435 Oral     SpO2 03/01/16 1411 99 %     Weight --      Height --      Head Circumference --      Peak Flow --      Pain Score 03/01/16 1339 0     Pain Loc --      Pain Edu? --      Excl. in [place]? --      Constitutional: Alert and oriented. Well appearing and  in no distress. HEENT   Head: Normocephalic and atraumatic.      Eyes: Conjunctivae are normal. PERRL. Normal extraocular movements.      Ears:         Nose: No congestion/rhinnorhea.   Mouth/Throat: Mucous membranes are moist.  Gums oozing.   Neck: No stridor. Cardiovascular/Chest: Normal rate, regular rhythm.  No murmurs, rubs, or gallops. Respiratory: Normal respiratory effort without tachypnea nor retractions. Breath sounds are clear and equal bilaterally. No wheezes/rales/rhonchi. Gastrointestinal: Soft. No distention, no guarding, no rebound. Nontender.    Genitourinary/rectal:Deferred Musculoskeletal: Nontender with normal range of motion in all extremities. No joint effusions.  No lower extremity tenderness.  No edema. Neurologic:  Normal speech and language. No gross or  focal neurologic deficits are appreciated. Skin:  Skin is warm, dry and intact. Multiple ecchymosis and petechiae over the extremities. Psychiatric: Mood and affect are normal. Speech and behavior are normal. Patient exhibits appropriate insight and judgment.  ____________________________________________   EKG I, Lisa Roca, MD, the attending physician have personally viewed and interpreted all ECGs.  None ____________________________________________  LABS (pertinent positives/negatives)  Labs Reviewed  COMPREHENSIVE METABOLIC PANEL - Abnormal; Notable for the following:       Result Value   Glucose, Bld 208 (*)    All other components within normal limits  CBC WITH DIFFERENTIAL/PLATELET - Abnormal; Notable for the following:    WBC 0.5 (*)    RBC 3.08 (*)    Hemoglobin 9.5 (*)    HCT 26.9 (*)    Platelets 9 (*)    Neutro Abs 0.0 (*)    Lymphs Abs 0.5 (*)    Monocytes Absolute 0.0 (*)    All other components within normal limits  URINALYSIS COMPLETEWITH MICROSCOPIC ([facility] ONLY) - Abnormal; Notable for the following:    Color, Urine YELLOW (*)    APPearance HAZY (*)    Glucose, UA 50 (*)     Hgb urine dipstick 2+ (*)    Squamous Epithelial / LPF 0-5 (*)    All other components within normal limits  CULTURE, BLOOD (ROUTINE X 2)  CULTURE, BLOOD (ROUTINE X 2)  URINE CULTURE  TYPE AND SCREEN  PREPARE PLATELET PHERESIS    ____________________________________________  RADIOLOGY All Xrays were viewed by me. Imaging interpreted by Radiologist.  Chest x-ray: No active disease. __________________________________________  PROCEDURES  Procedure(s) performed: None  Critical Care performed: None  ____________________________________________   ED COURSE / ASSESSMENT AND PLAN  Pertinent labs & imaging results that were available during my care of the patient were reviewed by me and considered in my medical decision making (see chart for details).   This patient is here for fever at home although afebrile here she did have Tylenol. She is reporting fever to 100.6. She is neutropenic on labs consistent with prior neutropenia. She has no source of infection on exam but I will send blood cultures as well as culture of the urine as well.  She has no acute anemia, but she does have a low platelet count.  Patient will be admitted for platelet transfusion and antibiotics until cultures back from neutropenic fever.   Based on prior hospital stay, I initiated vancomycin, Zosyn, and meropenem for double pseudomonas coverage. This was agreed to after a spoke with Dr. Grayland Ormond with oncology.  He also recommended leukocyte reduced, irradiated platelet transfusion and hospital admission.    CONSULTATIONS:  Oncology on call Dr. Dr. Grayland Ormond.  Hospitalist for admission.   Patient / Family / Caregiver informed of clinical course, medical decision-making process, and agree with plan.     ___________________________________________   FINAL CLINICAL IMPRESSION(S) / ED DIAGNOSES   Final diagnoses:  Thrombocytopenia (HCC)  Neutropenia, unspecified type ([place])  Neutropenic fever  ([place])              Note: This dictation was prepared with Dragon dictation. Any transcriptional errors that result from this process are unintentional    Lisa Roca, MD 03/01/16 1620

## 2016-03-01 NOTE — ED Triage Notes (Signed)
Pt states she has MDS and has had bleeding gums since last night with a temp 100.6.Marland Kitchen States last chemo treatment was 2 weeks ago.Marland Kitchen

## 2016-03-02 DIAGNOSIS — R509 Fever, unspecified: Secondary | ICD-10-CM

## 2016-03-02 DIAGNOSIS — E049 Nontoxic goiter, unspecified: Secondary | ICD-10-CM

## 2016-03-02 DIAGNOSIS — M8580 Other specified disorders of bone density and structure, unspecified site: Secondary | ICD-10-CM

## 2016-03-02 DIAGNOSIS — M129 Arthropathy, unspecified: Secondary | ICD-10-CM

## 2016-03-02 DIAGNOSIS — R5383 Other fatigue: Secondary | ICD-10-CM

## 2016-03-02 DIAGNOSIS — D649 Anemia, unspecified: Secondary | ICD-10-CM

## 2016-03-02 DIAGNOSIS — Z85828 Personal history of other malignant neoplasm of skin: Secondary | ICD-10-CM

## 2016-03-02 DIAGNOSIS — Z87891 Personal history of nicotine dependence: Secondary | ICD-10-CM

## 2016-03-02 DIAGNOSIS — D4622 Refractory anemia with excess of blasts 2: Secondary | ICD-10-CM

## 2016-03-02 DIAGNOSIS — D696 Thrombocytopenia, unspecified: Secondary | ICD-10-CM

## 2016-03-02 DIAGNOSIS — E785 Hyperlipidemia, unspecified: Secondary | ICD-10-CM

## 2016-03-02 DIAGNOSIS — D709 Neutropenia, unspecified: Secondary | ICD-10-CM

## 2016-03-02 DIAGNOSIS — R531 Weakness: Secondary | ICD-10-CM

## 2016-03-02 LAB — PREPARE RBC (CROSSMATCH)

## 2016-03-02 LAB — CBC
HCT: 21.6 % — ABNORMAL LOW (ref 35.0–47.0)
Hemoglobin: 7.6 g/dL — ABNORMAL LOW (ref 12.0–16.0)
MCH: 30.7 pg (ref 26.0–34.0)
MCHC: 35.3 g/dL (ref 32.0–36.0)
MCV: 86.8 fL (ref 80.0–100.0)
PLATELETS: 8 10*3/uL — AB (ref 150–440)
RBC: 2.48 MIL/uL — ABNORMAL LOW (ref 3.80–5.20)
RDW: 13.8 % (ref 11.5–14.5)
WBC: 0.9 10*3/uL — AB (ref 3.6–11.0)

## 2016-03-02 LAB — BASIC METABOLIC PANEL
ANION GAP: 5 (ref 5–15)
BUN: 12 mg/dL (ref 6–20)
CALCIUM: 8.6 mg/dL — AB (ref 8.9–10.3)
CO2: 24 mmol/L (ref 22–32)
Chloride: 110 mmol/L (ref 101–111)
Creatinine, Ser: 0.5 mg/dL (ref 0.44–1.00)
GLUCOSE: 115 mg/dL — AB (ref 65–99)
Potassium: 4.2 mmol/L (ref 3.5–5.1)
SODIUM: 139 mmol/L (ref 135–145)

## 2016-03-02 LAB — PLATELET COUNT: Platelets: 29 10*3/uL — CL (ref 150–440)

## 2016-03-02 LAB — GLUCOSE, CAPILLARY
GLUCOSE-CAPILLARY: 116 mg/dL — AB (ref 65–99)
Glucose-Capillary: 145 mg/dL — ABNORMAL HIGH (ref 65–99)

## 2016-03-02 LAB — URINE CULTURE: Culture: NO GROWTH

## 2016-03-02 LAB — HEMOGLOBIN A1C: Hgb A1c MFr Bld: 6.4 % — ABNORMAL HIGH (ref 4.0–6.0)

## 2016-03-02 LAB — HEMATOCRIT: HEMATOCRIT: 31.5 % — AB (ref 35.0–47.0)

## 2016-03-02 LAB — HEMOGLOBIN: Hemoglobin: 11 g/dL — ABNORMAL LOW (ref 12.0–16.0)

## 2016-03-02 MED ORDER — SODIUM CHLORIDE 0.9 % IV SOLN
Freq: Once | INTRAVENOUS | Status: AC
  Administered 2016-03-02: 10:00:00 via INTRAVENOUS

## 2016-03-02 MED ORDER — INSULIN ASPART 100 UNIT/ML ~~LOC~~ SOLN: 3.0000 [IU] | Freq: Three times a day (TID) | SUBCUTANEOUS | Status: DC

## 2016-03-02 MED ORDER — INSULIN ASPART 100 UNIT/ML ~~LOC~~ SOLN
0.0000 [IU] | Freq: Three times a day (TID) | SUBCUTANEOUS | Status: DC
Start: 2016-03-02 — End: 2016-03-03

## 2016-03-02 MED ORDER — SODIUM CHLORIDE 0.9 % IV SOLN
Freq: Once | INTRAVENOUS | Status: AC
  Administered 2016-03-02: 12:00:00 via INTRAVENOUS

## 2016-03-02 MED ORDER — SODIUM CHLORIDE 0.9 % IV SOLN: Freq: Once | INTRAVENOUS | Status: DC

## 2016-03-02 MED ORDER — LORATADINE 10 MG PO TABS
10.0000 mg | ORAL_TABLET | Freq: Every day | ORAL | Status: DC
  Administered 2016-03-02: 10 mg via ORAL
  Filled 2016-03-02: qty 1

## 2016-03-02 NOTE — Consult Note (Signed)
[facility]  [phone] [phone]  ID: Kathryn Boyd OB: Oct 10, 1940  MR#: [id number]  PPI#:[id number]  Patient Care Team: Ezequiel Kayser, MD as PCP - General (Internal Medicine)  CHIEF COMPLAINT: MDS, specifically refractory anemia with excess blasts-2, neutropenic fever, thrombocytopenia with bleeding gums, anemia.  INTERVAL HISTORY: Patient is a 74 year old female receiving chemotherapy for MDS. Her last treatment was at the beginning of July where she received 7 days of Vidaza. Patient states she noted bleeding gums starting on Thursday which persisted until the weekend. Upon arrival to the emergency room she was found to have platelet count 9 as well as low-grade fevers. She has baseline neutropenic. She was also found to have a declining hemoglobin. Currently, she feels well and on her baseline. She has no neurologic complaint. She denies any chest pain, cough, or shortness of breath. She has no nausea, vomiting, consultation, or diarrhea. She has no urinary complaints. Patient otherwise feels well and offers no further specific complaints.  REVIEW OF SYSTEMS:   Review of Systems  Constitutional: Positive for fever and malaise/fatigue. Negative for weight loss.  Respiratory: Negative.  Negative for cough, hemoptysis and shortness of breath.   Cardiovascular: Negative.  Negative for chest pain.  Gastrointestinal: Negative.  Negative for abdominal pain, blood in stool and melena.  Genitourinary: Negative.  Negative for hematuria.  Musculoskeletal: Negative.   Neurological: Positive for weakness.  Endo/Heme/Allergies: Bruises/bleeds easily.  Psychiatric/Behavioral: Negative.     As per HPI. Otherwise, a complete review of systems is negatve.  PAST MEDICAL HISTORY: Past Medical History:  Diagnosis Date  . Anemia   . Arthritis   . Cancer ([place])    skin ca  . GERD (gastroesophageal reflux disease)   . 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 Hyperlipidemia   . Myeloid dysplasia ([place])   . Osteopenia   . PONV (postoperative nausea and vomiting)     PAST SURGICAL HISTORY: Past Surgical History:  Procedure Laterality Date  . ABDOMINAL SURGERY  1968   Exploratory. Turned out tubal pregnancy  . COLONOSCOPY  1999  . FUNCTIONAL ENDOSCOPIC SINUS SURGERY  2007  . LAPAROSCOPIC SALPINGOOPHERECTOMY  1968  . TUBAL LIGATION      FAMILY HISTORY: Family History  Problem Relation Age of Onset  . Breast cancer Maternal Aunt 65  . Breast cancer Paternal Aunt 72  . Lung cancer Father   . Breast cancer Maternal Aunt   . Heart disease Mother   . Stroke Mother   . Breast cancer Paternal Aunt   . Breast cancer Paternal Aunt   . Colon polyps Sister   . Heart disease Sister   . Rheum arthritis Sister        ADVANCED DIRECTIVES:    HEALTH MAINTENANCE: Social History  Substance Use Topics  . Smoking status: Former Smoker    Quit date: 11/19/1976  . Smokeless tobacco: Never Used  . Alcohol use Yes     Comment: rarely     Colonoscopy:  PAP:  Bone density:  Lipid panel:  Allergies  Allergen Reactions  . Codeine Nausea And Vomiting  . Lubiprostone Other (See Comments)    Abdominal cramps  . Statins Other (See Comments)  . Shellfish Allergy     Current Facility-Administered Medications  Medication Dose Route Frequency Provider Last Rate Last Dose  . 0.9 %  sodium chloride infusion   Intravenous Continuous Dustin Flock, MD 100 mL/hr at 03/02/16 0734    .  acetaminophen (TYLENOL) tablet 650 mg  650 mg Oral Q6H PRN Dustin Flock, MD   650 mg at 03/02/16 0011   Or  . acetaminophen (TYLENOL) suppository 650 mg  650 mg Rectal Q6H PRN Dustin Flock, MD      . antiseptic oral rinse (CPC / CETYLPYRIDINIUM CHLORIDE 0.05%) solution 7 mL  7 mL Mouth Rinse q12n4p Dustin Flock, MD      . chlorhexidine (PERIDEX) 0.12 % solution 15 mL  15 mL Mouth Rinse BID Dustin Flock, MD   15 mL at  03/02/16 0733  . diphenhydrAMINE (BENADRYL) capsule 25 mg  25 mg Oral QHS PRN Dustin Flock, MD   25 mg at 03/01/16 2313  . fluticasone (FLONASE) 50 MCG/ACT nasal spray 2 spray  2 spray Each Nare Daily PRN Dustin Flock, MD      . insulin aspart (novoLOG) injection 0-9 Units  0-9 Units Subcutaneous TID WC Theodoro Grist, MD      . insulin aspart (novoLOG) injection 3 Units  3 Units Subcutaneous TID WC Theodoro Grist, MD      . loratadine (CLARITIN) tablet 10 mg  10 mg Oral Daily PRN Dustin Flock, MD      . meropenem (MERREM) 1 g in sodium chloride 0.9 % 100 mL IVPB  1 g Intravenous Q8H Dustin Flock, MD   1 g at 03/02/16 0544  . ondansetron (ZOFRAN) tablet 4 mg  4 mg Oral Q6H PRN Dustin Flock, MD       Or  . ondansetron (ZOFRAN) injection 4 mg  4 mg Intravenous Q6H PRN Dustin Flock, MD      . pantoprazole (PROTONIX) EC tablet 40 mg  40 mg Oral Daily Dustin Flock, MD   40 mg at 03/02/16 0733  . prochlorperazine (COMPAZINE) tablet 10 mg  10 mg Oral Q6H PRN Dustin Flock, MD      . valACYclovir (VALTREX) tablet 500 mg  500 mg Oral Daily Dustin Flock, MD   500 mg at 03/02/16 0733    OBJECTIVE: Vitals:   03/02/16 1133 03/02/16 1204  BP: (!) 92/49 (!) 114/57  Pulse: 92 94  Resp: 18 18  Temp: 97.8 F (36.6 C) 97.8 F (36.6 C)     Body mass index is 27.09 kg/m.    ECOG FS:1 - Symptomatic but completely ambulatory  General: Well-developed, well-nourished, no acute distress. Eyes: Pink conjunctiva, anicteric sclera. HEENT: Normocephalic, moist mucous membranes, clear oropharnyx. Lungs: Clear to auscultation bilaterally. Heart: Regular rate and rhythm. No rubs, murmurs, or gallops. Abdomen: Soft, nontender, nondistended. No organomegaly noted, normoactive bowel sounds. Musculoskeletal: No edema, cyanosis, or clubbing. Neuro: Alert, answering all questions appropriately. Cranial nerves grossly intact. Skin: No rashes or petechiae noted. Psych: Normal affect. Lymphatics: No  cervical, calvicular, axillary or inguinal LAD.   LAB RESULTS:  Lab Results  Component Value Date   NA 139 03/02/2016   K 4.2 03/02/2016   CL 110 03/02/2016   CO2 24 03/02/2016   GLUCOSE 115 (H) 03/02/2016   BUN 12 03/02/2016   CREATININE 0.50 03/02/2016   CALCIUM 8.6 (L) 03/02/2016   PROT 7.4 03/01/2016   ALBUMIN 3.5 03/01/2016   AST 19 03/01/2016   ALT 21 03/01/2016   ALKPHOS 114 03/01/2016   BILITOT 0.6 03/01/2016   GFRNONAA >60 03/02/2016   GFRAA >60 03/02/2016    Lab Results  Component Value Date   WBC 0.9 (LL) 03/02/2016   NEUTROABS 0.0 (L) 03/01/2016   HGB 7.6 (L) 03/02/2016   HCT 21.6 (L)  03/02/2016   MCV 86.8 03/02/2016   PLT 8 (LL) 03/02/2016     STUDIES: Dg Chest Port 1 View  Result Date: 03/01/2016 CLINICAL DATA:  Neutropenia and fever EXAM: PORTABLE CHEST 1 VIEW COMPARISON:  02/19/2016 FINDINGS: The heart size and mediastinal contours are within normal limits. Both lungs are clear. The visualized skeletal structures are unremarkable. IMPRESSION: No active disease. Electronically Signed   By: Inez Catalina M.D.   On: 03/01/2016 15:43  Dg Chest Port 1 View  Result Date: 02/19/2016 CLINICAL DATA:  Fever today. EXAM: PORTABLE CHEST 1 VIEW COMPARISON:  Radiographs 03/04/2012 FINDINGS: The cardiomediastinal contours are normal. The lungs are clear. Pulmonary vasculature is normal. No consolidation, pleural effusion, or pneumothorax. Stable mild biapical pleural parenchymal scarring. No acute osseous abnormalities are seen. IMPRESSION: No active disease. Electronically Signed   By: Jeb Levering M.D.   On: 02/19/2016 23:27    ASSESSMENT: MDS, specifically refractory anemia with excess blasts-2, neutropenic fever, thrombocytopenia with bleeding gums, anemia.  PLAN:    1. MDS, specifically refractory anemia with excess blasts-2: Patient completed her third cycle of Vidaza approximately 3 weeks ago. She was scheduled to have a bone marrow biopsy at [facility] tomorrow,  but will reschedule CT-guided bone marrow biopsy here since patient is inpatient. She has been instructed to keep her follow-up appointment on Friday with [facility]. 2. 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 3. Thrombocytopenia: Patient also had bleeding gums, she is on her second unit of platelets. Recheck CBC in the morning. 4. Anemia: Patient's drop in hemoglobin is likely secondary to hydration. One unit packed red blood cells today. All blood products should be irradiated. 5. Fevers: Blood cultures negative to date. Patient was previously taking prophylactic Levaquin, fluconazole, and Valtrex. Continue current antibiotics.  Appreciate consult, will follow.  Lloyd Huger, MD   03/02/2016 2:24 PM

## 2016-03-02 NOTE — Progress Notes (Signed)
[facility] [place] NAME: Kathryn Boyd    MR#:  [id number]  DATE OF BIRTH:  1940-12-23  SUBJECTIVE:  CHIEF COMPLAINT:   Chief Complaint  Patient presents with  . Other    bleeding gums  . Fever  The patient is 75 year old Caucasian female with past medical history significant for history of MDS, who was recently admitted with high fever at home, is coming back to the hospital with complaints of weakness, feeling tired and high fevers. Her oncologist recommended to come to emergency room for further evaluation. In emergency room, she was noted to have neutropenia, thrombocytopenia, pyuria, concerning for urinary tract infection. Patient did have diarrhea at night before admission but had no abdominal pain. She was admitted to the hospital for further evaluation and treatment with meropenem. Patient feels satisfactory, denies any pain, shortness of breath, admits of some cough but no significant sputum production, no  dysuria symptoms  Review of Systems  Constitutional: Positive for chills and fever.  Respiratory: Positive for cough.     VITAL SIGNS: Blood pressure (!) 114/57, pulse 94, temperature 97.8 F (36.6 C), temperature source Axillary, resp. rate 18, height 5\' 3"  (1.6 m), weight 69.4 kg (152 lb 14.4 oz), SpO2 100 %.  PHYSICAL EXAMINATION:   GENERAL:  75 y.o.-year-old patient lying in the bed with no acute distress.  EYES: Pupils equal, round, reactive to light and accommodation. No scleral icterus. Extraocular muscles intact.  HEENT: Head atraumatic, normocephalic. Oropharynx and nasopharynx clear.  NECK:  Supple, no jugular venous distention. No thyroid enlargement, no tenderness.  LUNGS: Normal breath sounds bilaterally, no wheezing, rales,rhonchi or crepitation. No use of accessory muscles of respiration.  CARDIOVASCULAR: S1, S2 normal. No murmurs, rubs, or gallops.  ABDOMEN: Soft, nontender, nondistended. Bowel sounds  present. No organomegaly or mass.  EXTREMITIES: No pedal edema, cyanosis, or clubbing.  NEUROLOGIC: Cranial nerves II through XII are intact. Muscle strength 5/5 in all extremities. Sensation intact. Gait not checked.  PSYCHIATRIC: The patient is alert and oriented x 3.  SKIN: No obvious rash, lesion, or ulcer.   ORDERS/RESULTS REVIEWED:   CBC  Recent Labs Lab 02/25/16 0915 03/01/16 1400 03/02/16 0440  WBC 0.5* 0.5* 0.9*  HGB 11.2* 9.5* 7.6*  HCT 32.5* 26.9* 21.6*  PLT 14* 9* 8*  MCV 88.5 87.3 86.8  MCH 30.5 30.8 30.7  MCHC 34.5 35.3 35.3  RDW 14.6* 13.7 13.8  LYMPHSABS 0.4* 0.5*  --   MONOABS 0.1* 0.0*  --   EOSABS 0.0 0.0  --   BASOSABS 0.0 0.0  --    ------------------------------------------------------------------------------------------------------------------  Chemistries   Recent Labs Lab 02/25/16 0915 03/01/16 1400 03/02/16 0440  NA 134* 135 139  K 3.6 3.9 4.2  CL 105 105 110  CO2 22 24 24   GLUCOSE 135* 208* 115*  BUN 14 17 12   CREATININE 0.72 0.63 0.50  CALCIUM 9.0 9.1 8.6*  AST  --  19  --   ALT  --  21  --   ALKPHOS  --  114  --   BILITOT  --  0.6  --    ------------------------------------------------------------------------------------------------------------------ estimated creatinine clearance is 57.7 mL/min (by C-G formula based on SCr of 0.8 mg/dL). ------------------------------------------------------------------------------------------------------------------ No results for input(s): TSH, T4TOTAL, T3FREE, THYROIDAB in the last 72 hours.  Invalid input(s): FREET3  Cardiac Enzymes No results for input(s): CKMB, TROPONINI, MYOGLOBIN in the last 168 hours.  Invalid input(s): CK ------------------------------------------------------------------------------------------------------------------ Invalid input(s):  POCBNP ---------------------------------------------------------------------------------------------------------------  RADIOLOGY: Dg Chest Port 1 View  Result Date: 03/01/2016 CLINICAL DATA:  Neutropenia and fever EXAM: PORTABLE CHEST 1 VIEW COMPARISON:  02/19/2016 FINDINGS: The heart size and mediastinal contours are within normal limits. Both lungs are clear. The visualized skeletal structures are unremarkable. IMPRESSION: No active disease. Electronically Signed   By: Inez Catalina M.D.   On: 03/01/2016 15:43   EKG:  Orders placed or performed during the hospital encounter of 02/19/16  . ED EKG 12-Lead  . ED EKG 12-Lead    ASSESSMENT AND PLAN:  Active Problems:   Neutropenic fever ([place]) #1. Neutropenic fever, blood cultures are negative so far, pending, urine culture is pending as well, continue antibiotic therapy adjusted about his depending on culture results. Oncology consult is pending. #2. Urinary tract infection, continue antibiotics, waiting for cultures #3. Acute posthemorrhagic anemia due to bleeding gums, transfuse packed red blood cells and platelets, follow hemoglobin level as outpatient #4. Thrombocytopenia, as above. Transfuse platelets, awaiting for oncologist input #5. Diabetes mellitus with hemoglobin A1c 7.2 about 2 weeks ago, initiate patient on sliding scale insulin, change diet to diabetic   Management plans discussed with the patient, family and they are in agreement.   DRUG ALLERGIES:  Allergies  Allergen Reactions  . Codeine Nausea And Vomiting  . Lubiprostone Other (See Comments)    Abdominal cramps  . Statins Other (See Comments)  . Shellfish Allergy     CODE STATUS:     Code Status Orders        Start     Ordered   03/01/16 1648  Full code  Continuous     03/01/16 1649    Code Status History    Date Active Date Inactive Code Status Order ID Comments User Context   02/20/2016  2:24 AM 02/22/2016  2:16 AM Full Code [id number]  Harrie Foreman, MD Inpatient   12/26/2015  3:57 PM 12/27/2015  9:56 PM Full Code [id number]  Vaughan Basta, MD Inpatient   12/26/2015  3:04 PM 12/26/2015  3:57 PM DNR [id number]  Vaughan Basta, MD ED      TOTAL TIME TAKING CARE OF THIS PATIENT: 40 minutes.    Theodoro Grist M.D on 03/02/2016 at 12:46 PM  Between 7am to 6pm - Pager - [phone]  After 6pm go to [email or web address] - password [email or web address]  Office  [phone]  CC: Primary care physician; Ezequiel Kayser, MD

## 2016-03-02 NOTE — Progress Notes (Signed)
critical platelet count at 8 this morning spoke to dr Marcille Blanco, platelets x 1 units transfused last night patient continues to bleed from gums. Dr Marcille Blanco will enter order for more platelets.

## 2016-03-03 ENCOUNTER — Telehealth: Payer: Self-pay

## 2016-03-03 ENCOUNTER — Inpatient Hospital Stay: Payer: Medicare Other

## 2016-03-03 ENCOUNTER — Encounter: Payer: Self-pay | Admitting: Oncology

## 2016-03-03 LAB — TYPE AND SCREEN
ABO/RH(D): O POS
ANTIBODY SCREEN: NEGATIVE
Unit division: 0

## 2016-03-03 LAB — CBC
HCT: 26.1 % — ABNORMAL LOW (ref 35.0–47.0)
Hemoglobin: 9.3 g/dL — ABNORMAL LOW (ref 12.0–16.0)
MCH: 30.9 pg (ref 26.0–34.0)
MCHC: 35.5 g/dL (ref 32.0–36.0)
MCV: 86.9 fL (ref 80.0–100.0)
PLATELETS: 24 10*3/uL — AB (ref 150–440)
RBC: 3 MIL/uL — AB (ref 3.80–5.20)
RDW: 13.8 % (ref 11.5–14.5)
WBC: 1 10*3/uL — CL (ref 3.6–11.0)

## 2016-03-03 LAB — GLUCOSE, CAPILLARY
Glucose-Capillary: 132 mg/dL — ABNORMAL HIGH (ref 65–99)
Glucose-Capillary: 105 mg/dL — ABNORMAL HIGH (ref 65–99)

## 2016-03-03 LAB — PREPARE PLATELET PHERESIS: Unit division: 0

## 2016-03-03 MED ORDER — BUPIVACAINE HCL (PF) 0.25 % IJ SOLN
INTRAMUSCULAR | Status: AC
  Administered 2016-03-03: 12:00:00
  Filled 2016-03-03: qty 30

## 2016-03-03 MED ORDER — BUPIVACAINE HCL (PF) 0.25 % IJ SOLN
INTRAMUSCULAR | Status: DC | PRN
  Administered 2016-03-03: 20 mL

## 2016-03-03 MED ORDER — HEPARIN SOD (PORK) LOCK FLUSH 100 UNIT/ML IV SOLN
INTRAVENOUS | Status: AC
  Administered 2016-03-03: 12:00:00
  Filled 2016-03-03: qty 5

## 2016-03-03 NOTE — Telephone Encounter (Signed)
Gums are bleeding and they hurt

## 2016-03-03 NOTE — Progress Notes (Signed)
Patient discharge packet review, verbalizing understanding. No new prescriptions. Patient's husband to transport home.

## 2016-03-03 NOTE — Care Management (Signed)
Admitted to [facility] with the diagnosis of neutropenia. Lives with husband, Ernie Hew [phone]). Last seen Dr. Raechel Ache about 3 months. Insurance sends nurse yearly. No skilled facility. No home oxygen. Uses no aids for ambulation. No falls. Fair appetite. Takes care of all basic and instrumental activities of daily living herself, limited driving. Prescriptions are filled at [facility] in [place]. Husband will drive. Shelbie Ammons RN MSN CCM Care Management [phone]

## 2016-03-03 NOTE — Progress Notes (Signed)
Kathryn Boyd in [place] states patient must be NPO for CT guided bone marrow biopsy. Patient made aware she cannot have anything to eat or drink, ate a bite of a biscuit at 0630am but no appetite. Kizzie Fantasia states she is unsure when patient will go today. Will check back in a little while.

## 2016-03-03 NOTE — Procedures (Signed)
CT bone marrow biopsy  Complications:  None  Blood Loss: none  See dictation in canopy pacs

## 2016-03-03 NOTE — Progress Notes (Signed)
Patient updated plan for CT guided bone marrow biopsy around 11:30am today per Juanita in [place].

## 2016-03-03 NOTE — Care Management Important Message (Signed)
Important Message  Patient Details  Name: Kathryn Boyd MRN: [id number] Date of Birth: September 04, 1940   Medicare Important Message Given:  Yes    Shelbie Ammons, RN 03/03/2016, 9:28 AM

## 2016-03-03 NOTE — Progress Notes (Signed)
Pharmacy Antibiotic Note  Kathryn Boyd is a 75 y.o. female admitted on 03/01/2016 with febrile neutropenia.  Pharmacy has been consulted for meropenem dosing.  Plan: Meropenem 1 g IV q8h  Temp (24hrs), Avg:98.2 F (36.8 C), Min:97.8 F (36.6 C), Max:98.7 F (37.1 C)   Recent Labs Lab 03/01/16 1400 03/02/16 0440 03/03/16 0451  WBC 0.5* 0.9* 1.0*  CREATININE 0.63 0.50  --     Estimated Creatinine Clearance: 57.7 mL/min (by C-G formula based on SCr of 0.8 mg/dL).    Allergies  Allergen Reactions  . Codeine Nausea And Vomiting  . Lubiprostone Other (See Comments)    Abdominal cramps  . Statins Other (See Comments)  . Shellfish Allergy    Antimicrobials this admission: meropenem 7/29 >>  val Vancomycin and piperacillin/tazobactam 7/29 doses in ED  Dose adjustments this admission:  Microbiology results: 7/29 BCx: no growth < 24hours  7/29 UCx: no growth   Pharmacy will continue to monitor and adjust per consult.    Kathryn Boyd L 03/03/2016 9:25 AM

## 2016-03-03 NOTE — Progress Notes (Signed)
Initial Nutrition Assessment  DOCUMENTATION CODES:   Severe malnutrition in context of chronic illness  INTERVENTION:  -Monitor intake and cater to pt preferences once able to take po diet -Recommend Ensure Enlive po BID, each supplement provides 350 kcal and 20 grams of protein once diet progressed -Discussed high calorie, high protein foods with pt and husband and small frequent meals.  Pt and husband verbalized understanding and expect good compliance    NUTRITION DIAGNOSIS:   Malnutrition related to cancer and cancer related treatments as evidenced by energy intake < or equal to 75% for > or equal to 1 month, percent weight loss.    GOAL:   Patient will meet greater than or equal to 90% of their needs    MONITOR:   PO intake, Supplement acceptance, Weight trends  REASON FOR ASSESSMENT:   Malnutrition Screening Tool    ASSESSMENT:      Pt admitted with neutropenic fever, thrombocytopenia, bleeding gums, anemia. Planning CT guided bone marrow biopsy today  Past Medical History:  Diagnosis Date  . Anemia   . Arthritis   . Cancer ([place])    skin ca  . GERD (gastroesophageal reflux disease)   . 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 Hyperlipidemia   . Myeloid dysplasia ([place])   . Osteopenia   . PONV (postoperative nausea and vomiting)    Pt reports appetite has been down prior to admission especially following chemotherapy.    Medications reviewed: aspart, protonix, NS at 169m/hr Labs reviewed: glucose 115, FSBS 145, 116, 132  Nutrition-Focused physical exam completed. Findings are WDL for fat depletion, muscle depletion, and edema.    Diet Order:  Diet NPO time specified  Skin:  Reviewed, no issues  Last BM:  7/30  Height:   Ht Readings from Last 1 Encounters:  03/01/16 5' 3"  (1.6 m)    Weight: Pt and husband reports wt loss of 20 pounds in the last 3 months (12% wt loss in 3 months)  Wt Readings from  Last 1 Encounters:  03/01/16 152 lb 14.4 oz (69.4 kg)    Ideal Body Weight:     BMI:  Body mass index is 27.09 kg/m.  Estimated Nutritional Needs:   Kcal:  1725-2050 kcals/d  Protein:  83-103 g/d  Fluid:  >/= 17242md  EDUCATION NEEDS:   Education needs addressed  Kathryn Boyd) Weekend/On-Call pager [phone]

## 2016-03-04 NOTE — Discharge Summary (Signed)
[facility] at [place] NAME: Isys Tietje    MR#:  [id number]  DATE OF BIRTH:  [birth year]  DATE OF ADMISSION:  03/01/2016   ADMITTING PHYSICIAN: Dustin Flock, MD  DATE OF DISCHARGE: 03/03/2016  3:00 PM  PRIMARY CARE PHYSICIAN: THIES, DAVID, MD   ADMISSION DIAGNOSIS:  Thrombocytopenia ([place]) [D69.6] Neutropenic fever ([place]) [D70.9, R50.81] Neutropenia, unspecified type ([place]) [D70.9] DISCHARGE DIAGNOSIS:  Active Problems:   Neutropenic fever ([place])  SECONDARY DIAGNOSIS:   Past Medical History:  Diagnosis Date  . Anemia   . Arthritis   . Cancer ([place])    skin ca  . GERD (gastroesophageal reflux disease)   . 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 Hyperlipidemia   . Myeloid dysplasia ([place])   . Osteopenia   . PONV (postoperative nausea and vomiting)    HOSPITAL COURSE:  75 y.o. female with a known history of Myelodysplastic syndrome who was recently admitted on July 19 with similar type presentation presents to the ED with having fever at home  1. Neutropenic fever: Resolved.  Continue prophylactic Levaquin, fluconazole, and Valtrex 2. Urinary tract infection: Treated, urine culture had no growth 3. MDS, specifically refractory anemia with excess blasts-2: Patient completed her third cycle of Vidaza approximately 3 weeks ago. She was scheduled to have a bone marrow biopsy at [facility], but as she is admitted here CT-guided bone marrow biopsy was performed while she is inpatient. She has been instructed to keep her follow-up appointment on Friday with [facility]. 4. 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 5. Thrombocytopenia: Patient also had bleeding gums, she received 2 unit of platelets.  6. Anemia: Patient's drop in hemoglobin is likely secondary to hydration. One unit packed red blood cells while inpatient. All blood products should be irradiated.  DISCHARGE CONDITIONS:    stable CONSULTS OBTAINED:  Treatment Team:  Lloyd Huger, MD DRUG ALLERGIES:   Allergies  Allergen Reactions  . Codeine Nausea And Vomiting  . Lubiprostone Other (See Comments)    Abdominal cramps  . Statins Other (See Comments)  . Shellfish Allergy    DISCHARGE MEDICATIONS:     Medication List    TAKE these medications   acetaminophen 325 MG tablet Commonly known as:  TYLENOL Take 650 mg by mouth.   amoxicillin-clavulanate 500-125 MG tablet Commonly known as:  AUGMENTIN Take 1 tablet (500 mg total) by mouth 3 (three) times daily.   diphenhydrAMINE 25 MG tablet Commonly known as:  BENADRYL Take 25 mg by mouth at bedtime as needed for sleep.   docusate sodium 100 MG capsule Commonly known as:  COLACE Take 100 mg by mouth 2 (two) times daily. Reported on 11/27/2015   fluconazole 200 MG tablet Commonly known as:  DIFLUCAN Take 200 mg by mouth 2 (two) times daily.   fluticasone 50 MCG/ACT nasal spray Commonly known as:  FLONASE Place 1-2 sprays into the nose daily as needed for allergies or rhinitis. Reported on 02/11/2016   hydrocortisone 2.5 % rectal cream Commonly known as:  ANUSOL-HC Place 1 application rectally 2 (two) times daily.   levofloxacin 750 MG tablet Commonly known as:  LEVAQUIN Take 1 tablet (750 mg total) by mouth daily.   lidocaine-prilocaine cream Commonly known as:  EMLA Apply 1 application topically as needed. Apply to abdomen 1-2 hours prior to injections.   loratadine 10 MG tablet Commonly known as:  CLARITIN Take 10  mg by mouth daily as needed for allergies.   omeprazole 20 MG capsule Commonly known as:  PRILOSEC Take 20 mg by mouth daily.   ondansetron 8 MG tablet Commonly known as:  ZOFRAN Take 1 tablet (8 mg total) by mouth 2 (two) times daily as needed (Nausea or vomiting).   polyethylene glycol powder powder Commonly known as:  GLYCOLAX/MIRALAX Take 17 g by mouth daily as needed. Reported on 11/27/2015    prochlorperazine 10 MG tablet Commonly known as:  COMPAZINE Take 1 tablet (10 mg total) by mouth every 6 (six) hours as needed (Nausea or vomiting).   valACYclovir 500 MG tablet Commonly known as:  VALTREX Take 500 mg by mouth daily.        DISCHARGE INSTRUCTIONS:   DIET:  Regular diet DISCHARGE CONDITION:  Good ACTIVITY:  Activity as tolerated OXYGEN:  Home Oxygen: No.  Oxygen Delivery: room air DISCHARGE LOCATION:  home   If you experience worsening of your admission symptoms, develop shortness of breath, life threatening emergency, suicidal or homicidal thoughts you must seek medical attention immediately by calling 911 or calling your MD immediately  if symptoms less severe.  You Must read complete instructions/literature along with all the possible adverse reactions/side effects for all the Medicines you take and that have been prescribed to you. Take any new Medicines after you have completely understood and accpet all the possible adverse reactions/side effects.   Please note  You were cared for by a hospitalist during your hospital stay. If you have any questions about your discharge medications or the care you received while you were in the hospital after you are discharged, you can call the unit and asked to speak with the hospitalist on call if the hospitalist that took care of you is not available. Once you are discharged, your primary care physician will handle any further medical issues. Please note that NO REFILLS for any discharge medications will be authorized once you are discharged, as it is imperative that you return to your primary care physician (or establish a relationship with a primary care physician if you do not have one) for your aftercare needs so that they can reassess your need for medications and monitor your lab values.    On the day of Discharge:  VITAL SIGNS:  Blood pressure (!) 125/57, pulse 97, temperature 97.6 F (36.4 C), temperature  source Oral, resp. rate 18, height 5' 3"  (1.6 m), weight 69.4 kg (152 lb 14.4 oz), SpO2 100 %. PHYSICAL EXAMINATION:  GENERAL:  75 y.o.-year-old patient lying in the bed with no acute distress.  EYES: Pupils equal, round, reactive to light and accommodation. No scleral icterus. Extraocular muscles intact.  HEENT: Head atraumatic, normocephalic. Oropharynx and nasopharynx clear.  NECK:  Supple, no jugular venous distention. No thyroid enlargement, no tenderness.  LUNGS: Normal breath sounds bilaterally, no wheezing, rales,rhonchi or crepitation. No use of accessory muscles of respiration.  CARDIOVASCULAR: S1, S2 normal. No murmurs, rubs, or gallops.  ABDOMEN: Soft, non-tender, non-distended. Bowel sounds present. No organomegaly or mass.  EXTREMITIES: No pedal edema, cyanosis, or clubbing.  NEUROLOGIC: Cranial nerves II through XII are intact. Muscle strength 5/5 in all extremities. Sensation intact. Gait not checked.  PSYCHIATRIC: The patient is alert and oriented x 3.  SKIN: No obvious rash, lesion, or ulcer.  DATA REVIEW:   CBC  Recent Labs Lab 03/03/16 0451  WBC 1.0*  HGB 9.3*  HCT 26.1*  PLT 24*    Chemistries   Recent Labs  Lab 03/01/16 1400 03/02/16 0440  NA 135 139  K 3.9 4.2  CL 105 110  CO2 24 24  GLUCOSE 208* 115*  BUN 17 12  CREATININE 0.63 0.50  CALCIUM 9.1 8.6*  AST 19  --   ALT 21  --   ALKPHOS 114  --   BILITOT 0.6  --     Follow-up Information    Ezequiel Kayser, MD. Go on 03/13/2016.   Specialty:  Internal Medicine Why:  @ 3:30 PM Contact information: [place] [address] [phone]        Dellis Filbert, MD. Go on 03/07/2016.   Specialty:  Anesthesiology Why:  @ 7:30 AM Contact information: [address] [place] [address] [phone]            Management plans discussed with the patient, family and they are in agreement.  CODE STATUS: Full code  TOTAL TIME TAKING CARE OF  THIS PATIENT: 45 minutes.    [facility], Anhelica Fowers M.D on 03/04/2016 at 4:45 PM  Between 7am to 6pm - Pager - [phone]  After 6pm go to [email or web address] - Proofreader  [facility]  Office  [phone]  CC: Primary care physician; Ezequiel Kayser, MD   Note: This dictation was prepared with Dragon dictation along with smaller phrase technology. Any transcriptional errors that result from this process are unintentional.

## 2016-03-04 NOTE — Telephone Encounter (Signed)
I spoke with Dr. Grayland Ormond about patient, I think this is an old message. Patient was seen by Dr. Grayland Ormond yesterday and discharged from the hospital.

## 2016-03-06 LAB — CULTURE, BLOOD (ROUTINE X 2): Culture: NO GROWTH

## 2016-03-07 ENCOUNTER — Other Ambulatory Visit: Payer: Self-pay | Admitting: Oncology

## 2016-03-10 ENCOUNTER — Inpatient Hospital Stay: Payer: Medicare Other

## 2016-03-10 ENCOUNTER — Inpatient Hospital Stay: Payer: Medicare Other | Admitting: Internal Medicine

## 2016-03-13 ENCOUNTER — Telehealth: Payer: Self-pay | Admitting: *Deleted

## 2016-03-13 NOTE — Telephone Encounter (Signed)
-----   Message from Manus Rudd, RN sent at 03/13/2016 10:27 AM EDT ----- Lab called to let you know that patient bone marrow had to be repeated results will be delayed for an additional week.

## 2016-03-19 LAB — CULTURE, BLOOD (ROUTINE X 2): Culture: NO GROWTH

## 2016-03-20 LAB — PREPARE PLATELET PHERESIS: Unit division: 0

## 2016-03-23 ENCOUNTER — Encounter: Payer: Self-pay | Admitting: Oncology

## 2016-03-25 ENCOUNTER — Encounter: Payer: Self-pay | Admitting: Oncology

## 2016-04-08 ENCOUNTER — Other Ambulatory Visit: Payer: Self-pay | Admitting: *Deleted

## 2016-04-08 MED ORDER — PROCHLORPERAZINE MALEATE 10 MG PO TABS: 10.0000 mg | ORAL_TABLET | Freq: Four times a day (QID) | ORAL | 0 refills | Status: AC | PRN

## 2016-12-02 DEATH — deceased

## 2017-12-30 IMAGING — CT CT BIOPSY
1 of 2 series · 15 of 32 positions shown, 19 images · non-contrast
Comparison: none

INDICATION: Neutropenia

[Series 3: osteo bx 2.4 b70s · axial · 0.63mm/px · z∈[-892,-879]mm · 15 of 60 slices shown, 19 images]
[im 3/60  soft-tissue]
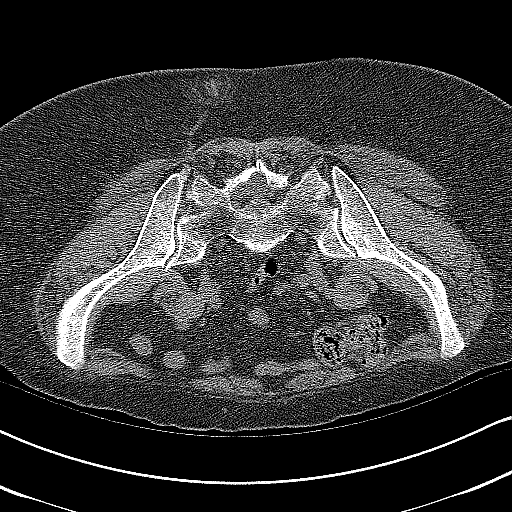
[im 3/60  bone]
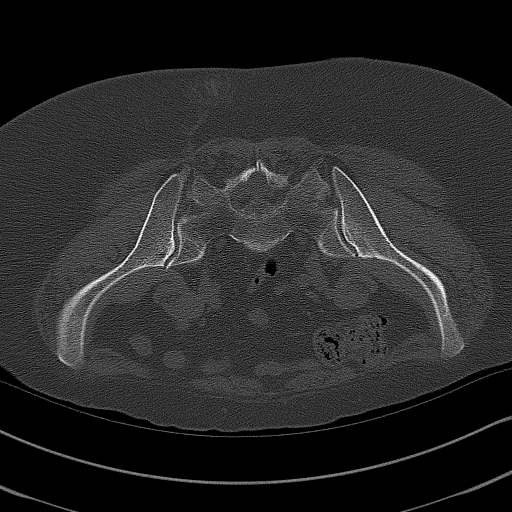
[im 8/60  soft-tissue]
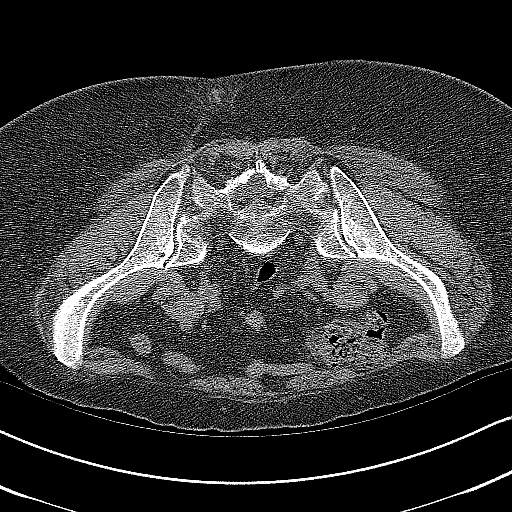
[im 13/60  soft-tissue]
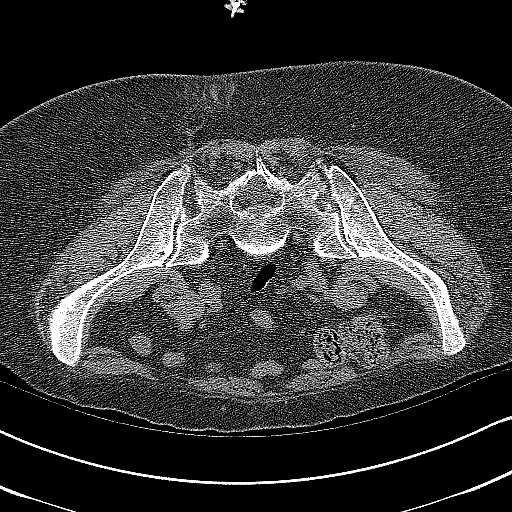
[im 18/60  soft-tissue]
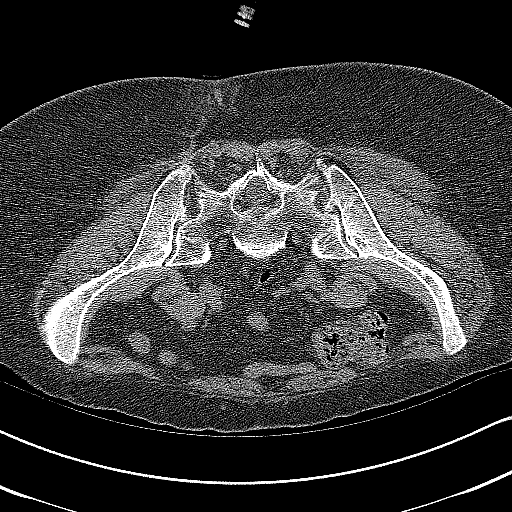
[im 20/60  soft-tissue]
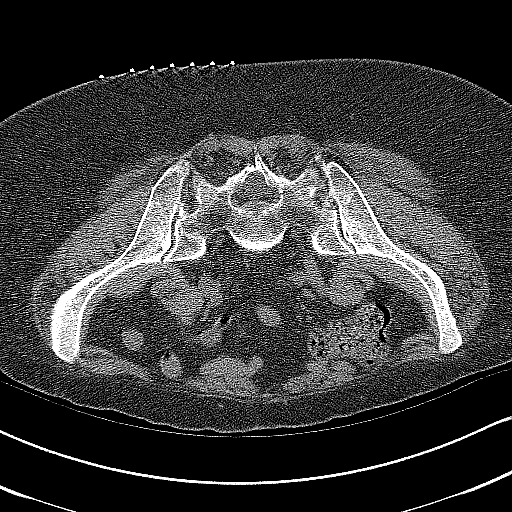
[im 25/60  soft-tissue]
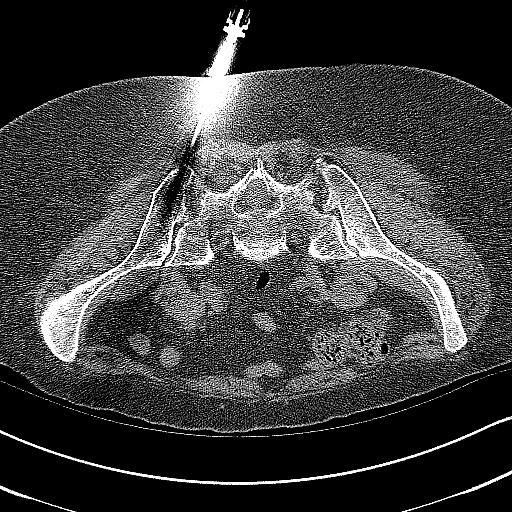
[im 30/60  soft-tissue]
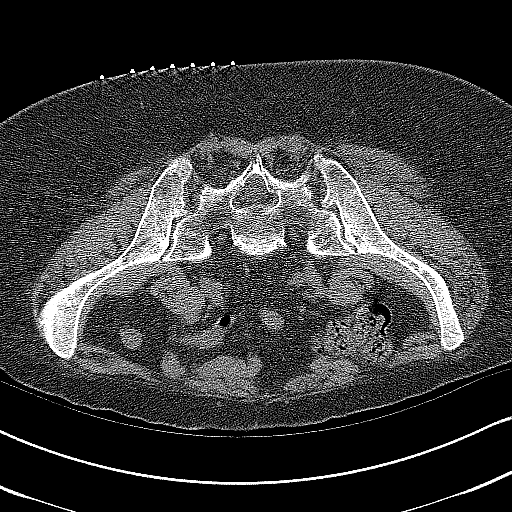
[im 35/60  soft-tissue]
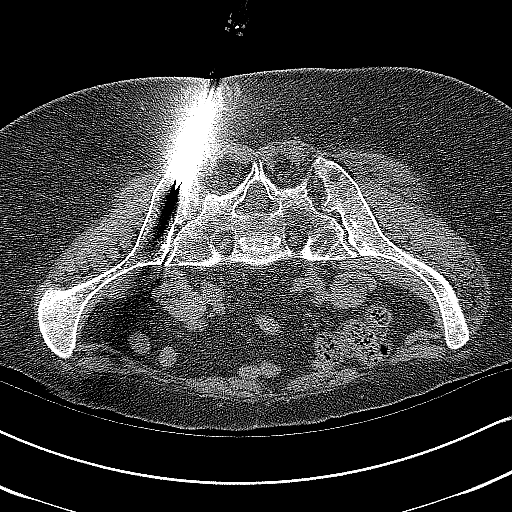
[im 40/60  soft-tissue]
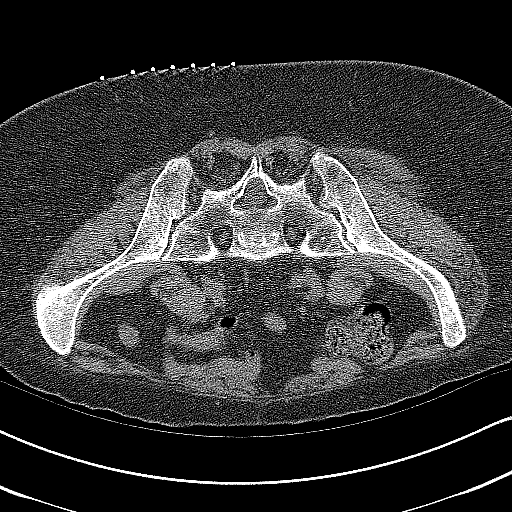
[im 40/60  bone]
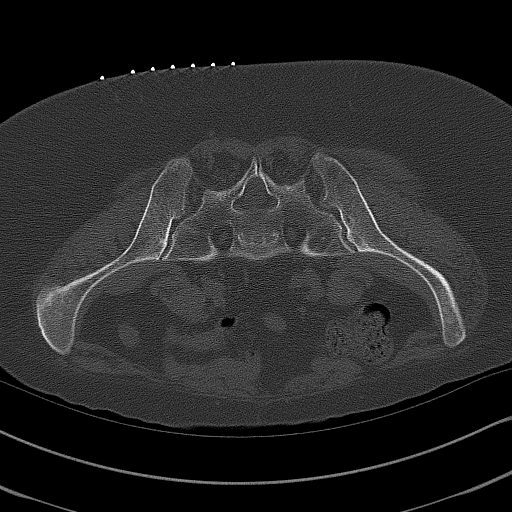
[im 42/60  soft-tissue]
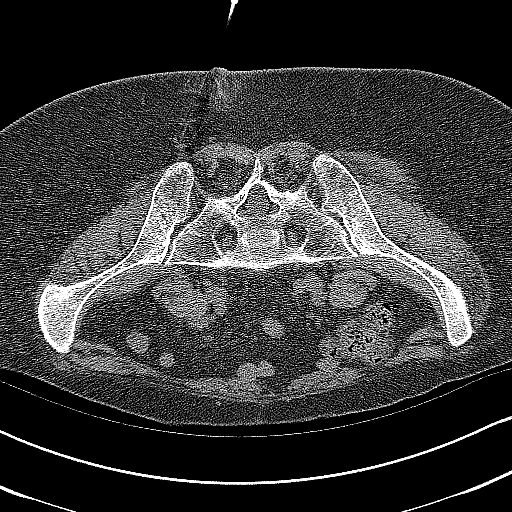
[im 47/60  soft-tissue]
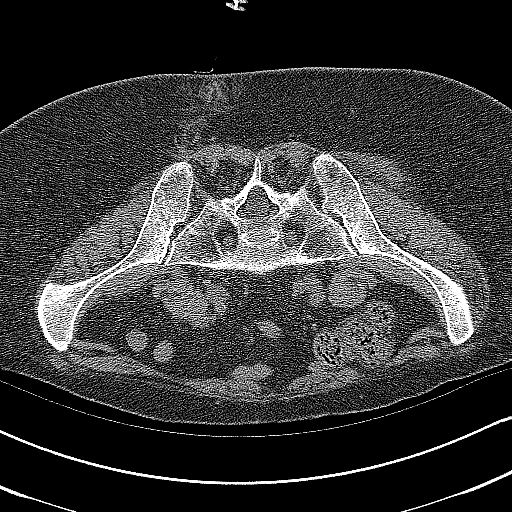
[im 50/60  lung]
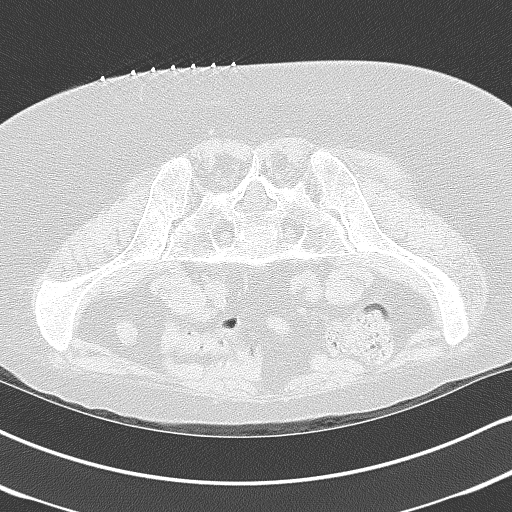
[im 52/60  soft-tissue]
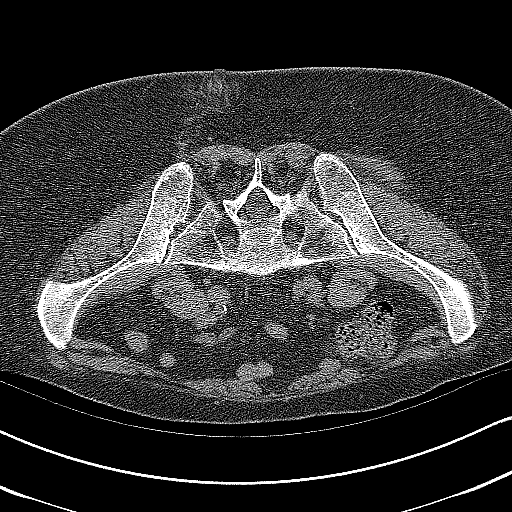
[im 52/60  lung]
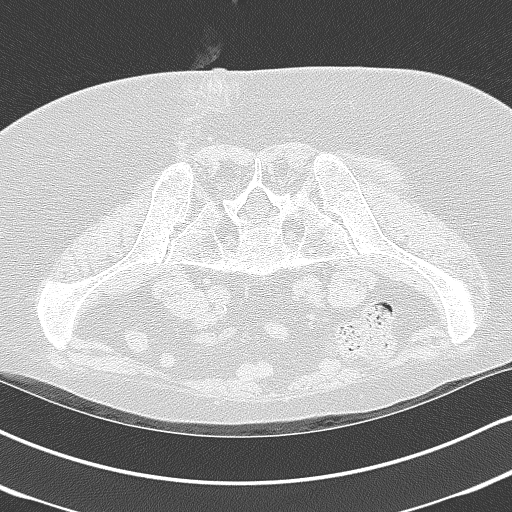
[im 55/60  lung]
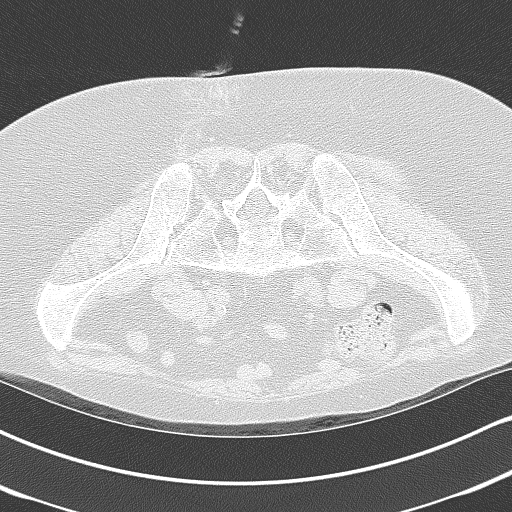
[im 57/60  soft-tissue]
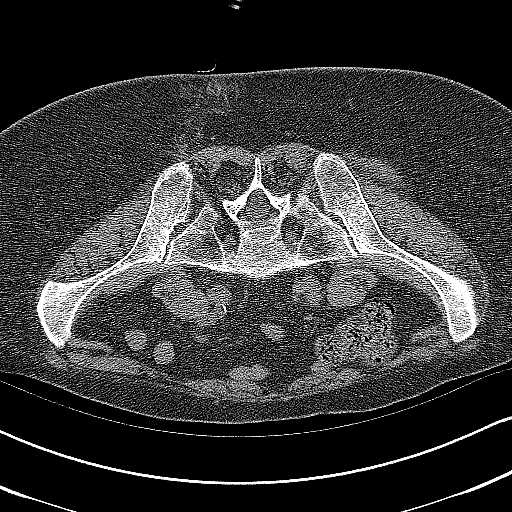
[im 57/60  lung]
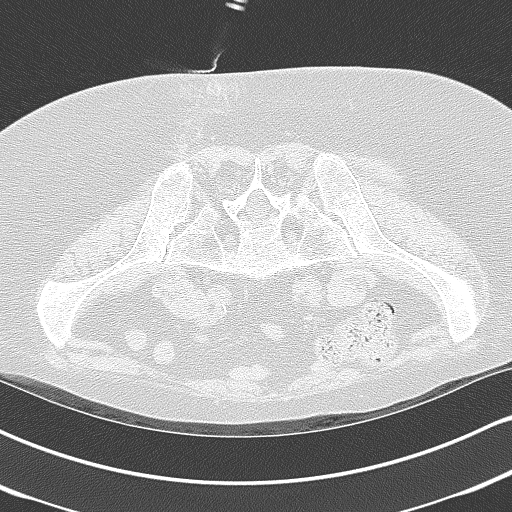

[15 of 32 positions shown; findings below may reference images not displayed]

EXAM:
CT BIOPSY

MEDICATIONS:
None.

ANESTHESIA/SEDATION:
Moderate (conscious) sedation was employed during this procedure. A
total of Versed 1 mg and Fentanyl 25 mcg was administered
intravenously.

Moderate Sedation Time: 16 minutes. The patient's level of
consciousness and vital signs were monitored continuously by
radiology nursing throughout the procedure under my direct
supervision.

FLUOROSCOPY TIME:  Not applicable

COMPLICATIONS:
None immediate.

PROCEDURE:
Informed written consent was obtained from the patient after a
thorough discussion of the procedural risks, benefits and
alternatives. All questions were addressed. Maximal Sterile Barrier
Technique was utilized including caps, mask, sterile gowns, sterile
gloves, sterile drape, hand hygiene and skin antiseptic. A timeout
was performed prior to the initiation of the procedure.

Following this the patient was placed on the CT table in the prone
position. Initial imaging was performed to localize the left iliac
bone. An appropriate area was marked and the skin was prepped in the
standard sterile manner utilizing chlorhexidine.

Following this utilizing 0.25% Marcaine and CT fluoroscopic
guidance, a bone marrow biopsy needle was placed percutaneously into
the left iliac bone. Aspiration was then performed and deemed the
less than adequate by the cytotechnologist. Additional samples were
obtained and also not felt to be of an adequate nature.

Subsequently, a bone core was obtained. A second core was obtained
due to the limited nature of the bone marrow aspiration. These were
both felt to be satisfactory. Needles were then removed. Hemostasis
was obtained at the puncture site. The patient tolerated the
procedure well was returned to her room in satisfactory condition.
IMPRESSION: Successful CT-guided bone marrow biopsy and aspiration.

## 2018-03-31 IMAGING — DX DG CHEST 1V PORT
1 series · 1 of 1 positions shown · non-contrast
Comparison: Radiographs 03/04/2012

CLINICAL DATA: Fever today.

EXAM:
PORTABLE CHEST 1 VIEW

[chest ap]
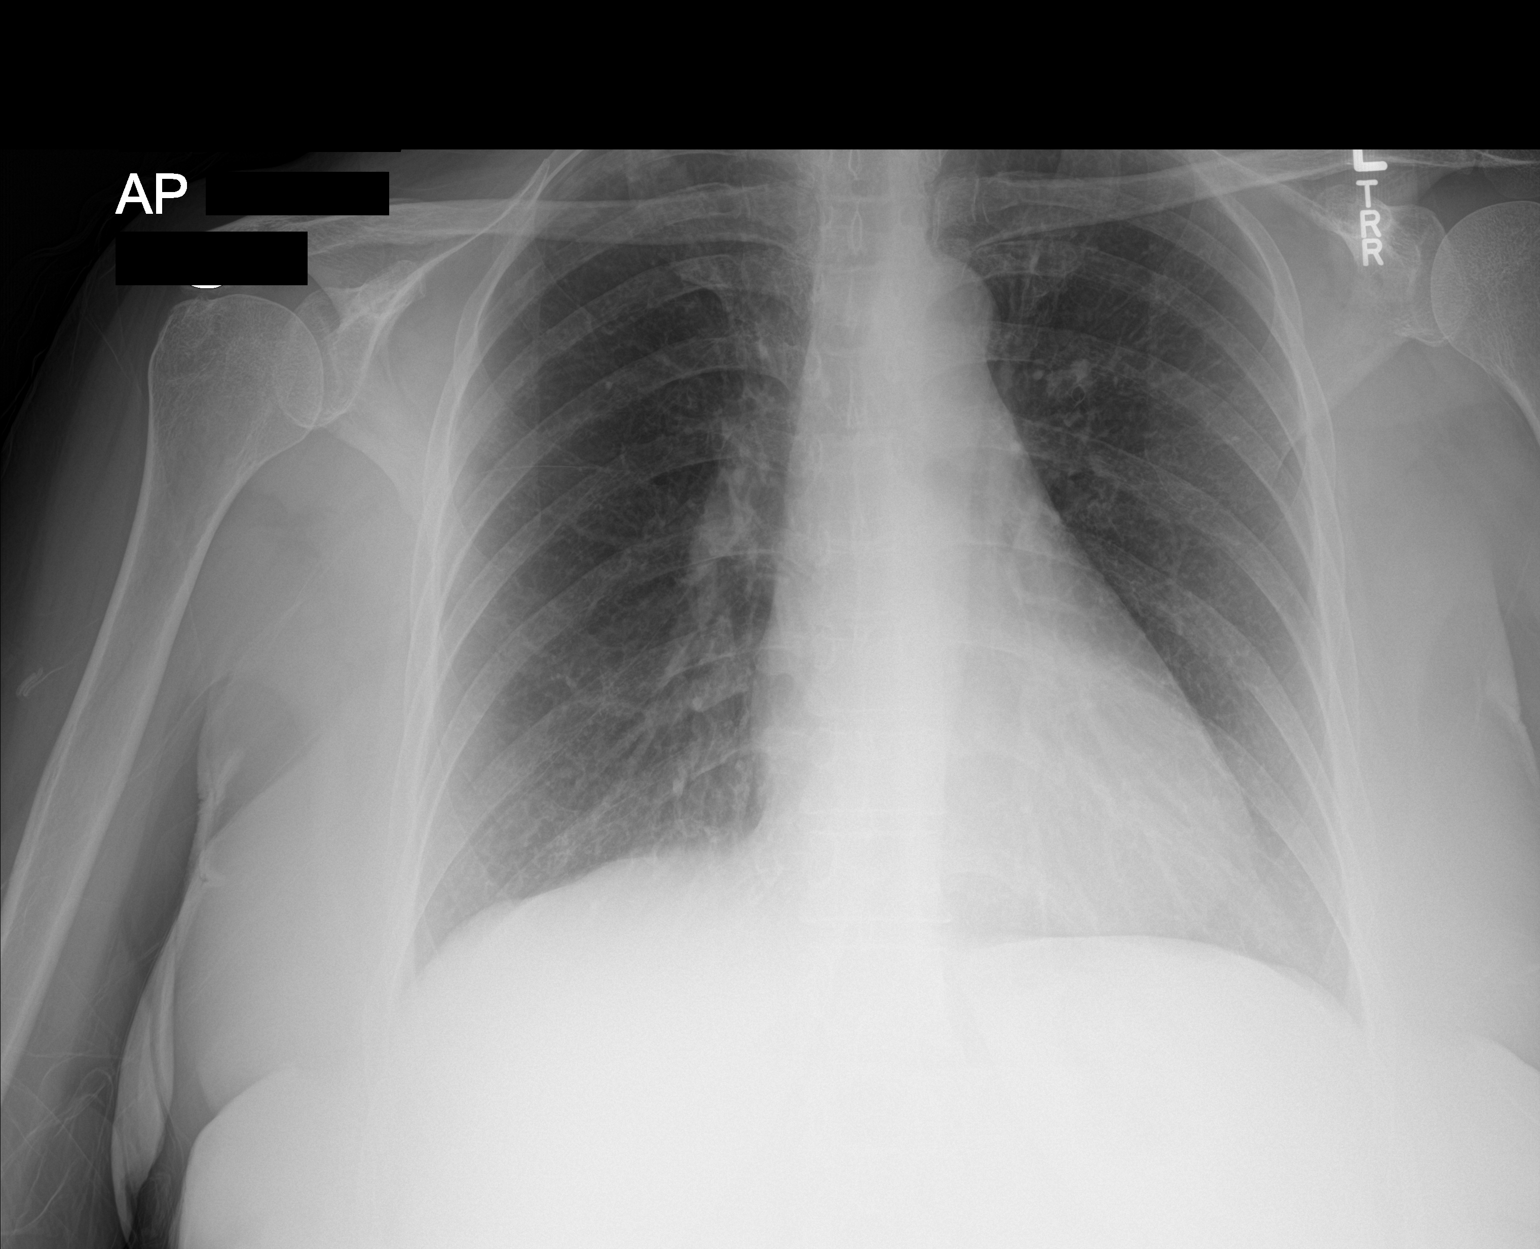

[1 of 1 positions shown; findings below may reference images not displayed]

FINDINGS: The cardiomediastinal contours are normal. The lungs are clear.
Pulmonary vasculature is normal. No consolidation, pleural effusion,
or pneumothorax. Stable mild biapical pleural parenchymal scarring.
No acute osseous abnormalities are seen.
IMPRESSION: No active disease.
